# Patient Record
Sex: Female | Born: 1990
Health system: Southern US, Community
[De-identification: ages and names within clinical notes are randomized; demographics above are authoritative.]

## PROBLEM LIST (undated history)

## (undated) DIAGNOSIS — M792 Neuralgia and neuritis, unspecified: Secondary | ICD-10-CM

## (undated) DIAGNOSIS — M545 Low back pain, unspecified: Secondary | ICD-10-CM

## (undated) DIAGNOSIS — G40909 Epilepsy, unspecified, not intractable, without status epilepticus: Secondary | ICD-10-CM

## (undated) DIAGNOSIS — M5410 Radiculopathy, site unspecified: Secondary | ICD-10-CM

## (undated) DIAGNOSIS — G43909 Migraine, unspecified, not intractable, without status migrainosus: Secondary | ICD-10-CM

## (undated) HISTORY — DX: Low back pain, unspecified: M54.50

## (undated) HISTORY — DX: Radiculopathy, site unspecified: M54.10

## (undated) HISTORY — PX: MENISCUS REPAIR: SHX5179

## (undated) HISTORY — DX: Neuralgia and neuritis, unspecified: M79.2

## (undated) HISTORY — DX: Epilepsy, unspecified, not intractable, without status epilepticus: G40.909

## (undated) HISTORY — PX: WISDOM TOOTH EXTRACTION: SHX21

---
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
## 2010-03-10 ENCOUNTER — Emergency Department (HOSPITAL_COMMUNITY): Admission: EM | Admit: 2010-03-10 | Discharge: 2010-03-10 | Payer: Self-pay | Admitting: Emergency Medicine

## 2010-04-26 ENCOUNTER — Emergency Department (HOSPITAL_COMMUNITY): Admission: EM | Admit: 2010-04-26 | Discharge: 2010-04-27 | Payer: Self-pay | Admitting: Emergency Medicine

## 2010-10-13 ENCOUNTER — Emergency Department (HOSPITAL_COMMUNITY): Admission: EM | Admit: 2010-10-13 | Discharge: 2010-10-13 | Payer: Self-pay | Admitting: Emergency Medicine

## 2011-02-02 ENCOUNTER — Emergency Department (HOSPITAL_COMMUNITY)
Admission: EM | Admit: 2011-02-02 | Discharge: 2011-02-03 | Disposition: A | Discharge status: Home / Self Care | Payer: Medicaid Other | Attending: Emergency Medicine | Admitting: Emergency Medicine

## 2011-02-02 DIAGNOSIS — M7989 Other specified soft tissue disorders: Secondary | ICD-10-CM | POA: Insufficient documentation

## 2011-02-02 DIAGNOSIS — M79609 Pain in unspecified limb: Secondary | ICD-10-CM | POA: Insufficient documentation

## 2011-02-02 DIAGNOSIS — L03019 Cellulitis of unspecified finger: Secondary | ICD-10-CM | POA: Insufficient documentation

## 2011-03-11 LAB — URINALYSIS, ROUTINE W REFLEX MICROSCOPIC
Glucose, UA: NEGATIVE mg/dL
Protein, ur: NEGATIVE mg/dL
Urobilinogen, UA: 0.2 mg/dL (ref 0.0–1.0)

## 2011-03-11 LAB — URINE CULTURE
Colony Count: >=100000
Culture  Setup Time: 201110172240

## 2011-03-11 LAB — URINE MICROSCOPIC-ADD ON

## 2011-03-17 LAB — URINALYSIS, ROUTINE W REFLEX MICROSCOPIC
Hgb urine dipstick: NEGATIVE
pH: 5.5 (ref 5.0–8.0)

## 2011-03-17 LAB — URINE CULTURE: Colony Count: >=100000

## 2011-03-17 LAB — PREGNANCY, URINE: Preg Test, Ur: NEGATIVE

## 2011-03-17 LAB — URINE MICROSCOPIC-ADD ON

## 2013-08-08 ENCOUNTER — Ambulatory Visit (INDEPENDENT_AMBULATORY_CARE_PROVIDER_SITE_OTHER): Payer: Medicaid Other | Admitting: Orthopedic Surgery

## 2013-08-08 ENCOUNTER — Encounter: Payer: Self-pay | Admitting: Orthopedic Surgery

## 2013-08-08 VITALS — BP 127/84 | Ht 67.0 in | Wt 230.0 lb

## 2013-08-08 DIAGNOSIS — IMO0002 Reserved for concepts with insufficient information to code with codable children: Secondary | ICD-10-CM

## 2013-08-08 DIAGNOSIS — S8392XA Sprain of unspecified site of left knee, initial encounter: Secondary | ICD-10-CM | POA: Insufficient documentation

## 2013-08-08 MED ORDER — IBUPROFEN 800 MG PO TABS: 800.0000 mg | ORAL_TABLET | Freq: Three times a day (TID) | ORAL | Status: DC | PRN

## 2013-08-08 MED ORDER — HYDROCODONE-ACETAMINOPHEN 5-325 MG PO TABS: 1.0000 | ORAL_TABLET | Freq: Four times a day (QID) | ORAL | Status: DC | PRN

## 2013-08-08 NOTE — Patient Instructions (Signed)
MRI ordered

## 2013-08-08 NOTE — Progress Notes (Signed)
Patient ID: Jessica Barry, female   DOB: 1991-01-22, 22 y.o.   MRN: [id number] Chief Complaint  Patient presents with  . Knee Pain    Left knee pain and sprain d/t fall 07/08/13    22 year old female I did jump a fence her right leg was caught landed on her left leg and she can weight-bear injured her left knee on July 12. 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 Body habitus endomorphic Ambulation with crutches Right knee range of motion full no tenderness or swelling strength and stability normal skin intact neurovascular function intact sensation normal  Left knee limited flexion 90 passive active 70. Full extension. Collateral ligaments feel stable I cannot assess the anterior cruciate ligament feels possibly loose muscle tone normal skin intact neurovascular exam normal  Sprain left knee  Continue crutches ibuprofen hydrocodone  MRI and then followup for evaluation of MRI and treatment recommendation

## 2013-08-22 ENCOUNTER — Telehealth: Payer: Self-pay | Admitting: *Deleted

## 2013-08-22 NOTE — Telephone Encounter (Signed)
Authorization # [id number] expires 09/20/13  MRI appointment scheduled for 08/24/13 3:45pm. Patient aware NPI # [id number] given per Coralee North Follow up made for 09/07/13 at 8:45am. Patient aware

## 2013-08-24 ENCOUNTER — Ambulatory Visit (HOSPITAL_COMMUNITY)
Admission: RE | Admit: 2013-08-24 | Discharge: 2013-08-24 | Disposition: A | Discharge status: Home / Self Care | Payer: Medicaid Other | Source: Ambulatory Visit | Attending: Orthopedic Surgery | Admitting: Orthopedic Surgery

## 2013-08-24 DIAGNOSIS — S83419A Sprain of medial collateral ligament of unspecified knee, initial encounter: Secondary | ICD-10-CM | POA: Insufficient documentation

## 2013-08-24 DIAGNOSIS — S83509A Sprain of unspecified cruciate ligament of unspecified knee, initial encounter: Secondary | ICD-10-CM | POA: Insufficient documentation

## 2013-08-24 DIAGNOSIS — S83289A Other tear of lateral meniscus, current injury, unspecified knee, initial encounter: Secondary | ICD-10-CM | POA: Insufficient documentation

## 2013-08-24 DIAGNOSIS — M25569 Pain in unspecified knee: Secondary | ICD-10-CM | POA: Insufficient documentation

## 2013-08-24 DIAGNOSIS — S8392XA Sprain of unspecified site of left knee, initial encounter: Secondary | ICD-10-CM

## 2013-08-24 DIAGNOSIS — W19XXXA Unspecified fall, initial encounter: Secondary | ICD-10-CM | POA: Insufficient documentation

## 2013-08-25 ENCOUNTER — Telehealth: Payer: Self-pay | Admitting: Orthopedic Surgery

## 2013-08-29 ENCOUNTER — Other Ambulatory Visit: Payer: Self-pay | Admitting: Orthopedic Surgery

## 2013-08-29 DIAGNOSIS — S8392XA Sprain of unspecified site of left knee, initial encounter: Secondary | ICD-10-CM

## 2013-08-29 MED ORDER — HYDROCODONE-ACETAMINOPHEN 5-325 MG PO TABS: 1.0000 | ORAL_TABLET | Freq: Four times a day (QID) | ORAL | Status: DC | PRN

## 2013-08-29 NOTE — Telephone Encounter (Signed)
Prescription has been refilled call in to pharmacy

## 2013-09-07 ENCOUNTER — Other Ambulatory Visit: Payer: Self-pay | Admitting: *Deleted

## 2013-09-07 ENCOUNTER — Encounter: Payer: Self-pay | Admitting: Orthopedic Surgery

## 2013-09-07 ENCOUNTER — Encounter (HOSPITAL_COMMUNITY): Payer: Self-pay | Admitting: Pharmacy Technician

## 2013-09-07 ENCOUNTER — Ambulatory Visit (INDEPENDENT_AMBULATORY_CARE_PROVIDER_SITE_OTHER): Payer: Self-pay | Admitting: Orthopedic Surgery

## 2013-09-07 VITALS — BP 119/73 | Ht 67.0 in | Wt 230.0 lb

## 2013-09-07 DIAGNOSIS — S83502D Sprain of unspecified cruciate ligament of left knee, subsequent encounter: Secondary | ICD-10-CM

## 2013-09-07 DIAGNOSIS — S83282S Other tear of lateral meniscus, current injury, left knee, sequela: Secondary | ICD-10-CM

## 2013-09-07 DIAGNOSIS — S83282D Other tear of lateral meniscus, current injury, left knee, subsequent encounter: Secondary | ICD-10-CM

## 2013-09-07 DIAGNOSIS — S8392XA Sprain of unspecified site of left knee, initial encounter: Secondary | ICD-10-CM

## 2013-09-07 DIAGNOSIS — M23302 Other meniscus derangements, unspecified lateral meniscus, unspecified knee: Secondary | ICD-10-CM | POA: Insufficient documentation

## 2013-09-07 DIAGNOSIS — IMO0002 Reserved for concepts with insufficient information to code with codable children: Secondary | ICD-10-CM

## 2013-09-07 DIAGNOSIS — Z5189 Encounter for other specified aftercare: Secondary | ICD-10-CM

## 2013-09-07 DIAGNOSIS — S83509A Sprain of unspecified cruciate ligament of unspecified knee, initial encounter: Secondary | ICD-10-CM | POA: Insufficient documentation

## 2013-09-07 DIAGNOSIS — S83289A Other tear of lateral meniscus, current injury, unspecified knee, initial encounter: Secondary | ICD-10-CM | POA: Insufficient documentation

## 2013-09-07 MED ORDER — HYDROCODONE-ACETAMINOPHEN 5-325 MG PO TABS: 1.0000 | ORAL_TABLET | ORAL | Status: DC | PRN

## 2013-09-07 NOTE — Addendum Note (Signed)
Addended by: Fuller Canada E on: 09/07/2013 01:40 PM   Modules accepted: Orders

## 2013-09-07 NOTE — Progress Notes (Signed)
Patient ID: Jessica Barry, female   DOB: 1991-02-24, 22 y.o.   MRN: [id number]  Chief Complaint  Patient presents with  . Results    MRI Results/s/p left knee injury     MRI followup shows that the patient is a torn medial collateral ligament, anterior cruciate ligament, lateral meniscus. However, she is low demand in terms of activity. I discussed this with her and discussed with her Medicaid limitations on therapy and the cost and we both agreed that she should undergo arthroscopic meniscectomy and rehabilitation on her own to see if she needs reconstructive surgery  Patient ID: Jessica Barry, female   DOB: December 26, 1991, 22 y.o.   MRN: [id number] Chief Complaint   Patient presents with   .  Knee Pain       Left knee pain and sprain d/t fall 07/08/13     22 year old female I did jump a fence her right leg was caught landed on her left leg and she can weight-bear injured her left knee on July 12. Since that time she's been on crutches or brace taken various pain medications and ibuprofen and presents with sharp dull throbbing 5 at 10 knee pain associated numbness tingling locking catching swelling stiffness and bruising review of systems chills cough diarrhea nausea frequency poor healing and itching of the skin numbness tingling unsteady gait depression excessive thirst and urination temperature intolerance the other systems were reviewed and are normal IMPRESSION: 1. Complex tear of the lateral meniscus, with posterior horn believed to be flipped adjacent to the anterior horn and possible radial tear of the anterior horn. 2. Torn anterior cruciate ligament. 3. Torn medial collateral ligament. Tear of the adjacent attachments of the medial patellar retinaculum and medial patellofemoral ligament. 4. Edema tracks along the fibular collateral ligament without overt fibular collateral ligament tear. 5. Moderate to large knee effusion. 6. Bone bruising pattern in the patellofemoral joint raising  the possibility of prior transient lateral patellar dislocation. There is also bone bruising posteriorly in the lateral tibial plateau and a pivot-shift mechanism of the bone bruising may also be present.  Independent review this MRI confirms the findings listed above  Patient has no neurologic symptoms at this time  Recommend arthroscopy followup after arthroscopy lateral meniscectomy

## 2013-09-07 NOTE — Patient Instructions (Addendum)
Surgery  Salk lateral menisectomy

## 2013-09-15 ENCOUNTER — Encounter (HOSPITAL_COMMUNITY): Payer: Self-pay

## 2013-09-15 ENCOUNTER — Encounter (HOSPITAL_COMMUNITY)
Admission: RE | Admit: 2013-09-15 | Discharge: 2013-09-15 | Disposition: A | Discharge status: Home / Self Care | Payer: Medicaid Other | Source: Ambulatory Visit | Attending: Orthopedic Surgery | Admitting: Orthopedic Surgery

## 2013-09-15 DIAGNOSIS — Z01818 Encounter for other preprocedural examination: Secondary | ICD-10-CM | POA: Insufficient documentation

## 2013-09-15 DIAGNOSIS — Z01812 Encounter for preprocedural laboratory examination: Secondary | ICD-10-CM | POA: Insufficient documentation

## 2013-09-15 HISTORY — DX: Migraine, unspecified, not intractable, without status migrainosus: G43.909

## 2013-09-15 LAB — HEMOGLOBIN AND HEMATOCRIT, BLOOD: Hemoglobin: 13.7 g/dL (ref 12.0–15.0)

## 2013-09-15 NOTE — Patient Instructions (Signed)
Jessica Barry  09/15/2013   Your procedure is scheduled on:  Friday, 09/22/13  Report to [facility] [place] [facility].  Call this number if you have problems the morning of surgery: [phone]   Remember:   Do not eat food or drink liquids after midnight.   Take these medicines the morning of surgery with A SIP OF WATER: norco if needed   Do not wear jewelry, make-up or nail polish.  Do not wear lotions, powders, or perfumes. You may wear deodorant.  Do not shave 48 hours prior to surgery. Men may shave face and neck.  Do not bring valuables to the hospital.  [facility] is not responsible                   for any belongings or valuables.  Contacts, dentures or bridgework may not be worn into surgery.  Leave suitcase in the car. After surgery it may be brought to your room.  For patients admitted to the hospital, checkout time is 11:00 AM the day of  discharge.   Patients discharged the day of surgery will not be allowed to drive  home.  Name and phone number of your driver: family  Special Instructions: Incentive Spirometry - Practice and bring it with you on the day of surgery. Shower using CHG 2 nights before surgery and the night before surgery.  If you shower the day of surgery use CHG.  Use special wash - you have one bottle of CHG for all showers.  You should use approximately 1/3 of the bottle for each shower.   Please read over the following fact sheets that you were given: Surgical Site Infection Prevention, Anesthesia Post-op Instructions and Care and Recovery After Surgery  Arthroscopic Procedure, Knee Care After Refer to this sheet in the next few weeks. These discharge instructions provide you with general information on caring for yourself after you leave the hospital. Your caregiver may also give you specific instructions. Your treatment has been planned according to the most current medical practices available, but unavoidable complications sometimes occur. If you have any  problems or questions after discharge, please call your caregiver. HOME CARE INSTRUCTIONS  It will be normal to be sore for a couple days after surgery. See your caregiver if this seems to be getting worse rather than better. Only take over-the-counter or prescription medicines for pain, discomfort, or fever as directed by your caregiver.  Take showers rather than baths, or as directed by your caregiver.  Change bandages (dressings) if necessary or as directed.  You may resume normal diet and activities as directed or allowed.  Avoid lifting or driving until you are directed otherwise.  Make an appointment to see your caregiver for stitches (suture) or staple removal as directed.  You may put ice on the area.  Put ice in a plastic bag.  Place a towel between your skin and the bag.  Leave the ice on for 15-20 minutes, 3-4 times per day for the first 2 days. SEEK MEDICAL CARE IF:   You have increased bleeding from your wounds.  You see redness, swelling, or have increasing pain in your wounds.  You have pus coming from your wound.  You have an oral temperature above 102 F (38.9 C).  You notice a bad smell coming from the wound or dressing.  You have severe pain with any motion of your knee. SEEK IMMEDIATE MEDICAL CARE IF:   You develop a rash.  You have  difficulty breathing  You develop any reaction or side effects to medicines taken. MAKE SURE YOU:   Understand these instructions.  Will watch your condition.  Will get help right away if you are not doing well or get worse. Document Released: 07/03/2005 Document Revised: 03/07/2012 Document Reviewed: 03/08/2008 [facility] Patient Information 2014 [place], [place]. Arthroscopic Procedure, Knee An arthroscopic procedure can find what is wrong with your knee. PROCEDURE Arthroscopy is a surgical technique that allows your orthopedic surgeon to diagnose and treat your knee injury with accuracy. They will look into your  knee through a small instrument. This is almost like a small (pencil sized) telescope. Because arthroscopy affects your knee less than open knee surgery, you can anticipate a more rapid recovery. Taking an active role by following your caregiver's instructions will help with rapid and complete recovery. Use crutches, rest, elevation, ice, and knee exercises as instructed. The length of recovery depends on various factors including type of injury, age, physical condition, medical conditions, and your rehabilitation. Your knee is the joint between the large bones (femur and tibia) in your leg. Cartilage covers these bone ends which are smooth and slippery and allow your knee to bend and move smoothly. Two menisci, thick, semi-lunar shaped pads of cartilage which form a rim inside the joint, help absorb shock and stabilize your knee. Ligaments bind the bones together and support your knee joint. Muscles move the joint, help support your knee, and take stress off the joint itself. Because of this all programs and physical therapy to rehabilitate an injured or repaired knee require rebuilding and strengthening your muscles. AFTER THE PROCEDURE  After the procedure, you will be moved to a recovery area until most of the effects of the medication have worn off. Your caregiver will discuss the test results with you.  Only take over-the-counter or prescription medicines for pain, discomfort, or fever as directed by your caregiver. SEEK MEDICAL CARE IF:   You have increased bleeding from your wounds.  You see redness, swelling, or have increasing pain in your wounds.  You have pus coming from your wound.  You have an oral temperature above 102 F (38.9 C).  You notice a bad smell coming from the wound or dressing.  You have severe pain with any motion of your knee. SEEK IMMEDIATE MEDICAL CARE IF:   You develop a rash.  You have difficulty breathing.  You have any allergic problems. Document  Released: 12/11/2000 Document Revised: 03/07/2012 Document Reviewed: 07/04/2008 [facility] Patient Information 2014 [place], [place]. PATIENT INSTRUCTIONS POST-ANESTHESIA  IMMEDIATELY FOLLOWING SURGERY:  Do not drive or operate machinery for the first twenty four hours after surgery.  Do not make any important decisions for twenty four hours after surgery or while taking narcotic pain medications or sedatives.  If you develop intractable nausea and vomiting or a severe headache please notify your doctor immediately.  FOLLOW-UP:  Please make an appointment with your surgeon as instructed. You do not need to follow up with anesthesia unless specifically instructed to do so.  WOUND CARE INSTRUCTIONS (if applicable):  Keep a dry clean dressing on the anesthesia/puncture wound site if there is drainage.  Once the wound has quit draining you may leave it open to air.  Generally you should leave the bandage intact for twenty four hours unless there is drainage.  If the epidural site drains for more than 36-48 hours please call the anesthesia department.  QUESTIONS?:  Please feel free to call your physician or the hospital operator  if you have any questions, and they will be happy to assist you.

## 2013-09-21 ENCOUNTER — Telehealth: Payer: Self-pay | Admitting: Orthopedic Surgery

## 2013-09-21 NOTE — H&P (Signed)
  Patient ID: Jessica Barry, female   DOB: 1991-08-16, 22 y.o.   MRN: [id number] Chief Complaint   Patient presents with   .  Knee Pain       Left knee pain and sprain d/t fall 07/08/13     22 year old female I did jump a fence her right leg was caught landed on her left leg and she can weight-bear injured her left knee on July 12. 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 Body habitus endomorphic Ambulation with crutches  Upper extremity exam  The right and left upper extremity:   Inspection and palpation revealed no abnormalities in the upper extremities.   Range of motion is full without contracture.  Motor exam is normal with grade 5 strength.  The joints are fully reduced without subluxation.  There is no atrophy or tremor and muscle tone is normal.  All joints are stable.    Right knee range of motion full no tenderness or swelling strength and stability normal skin intact neurovascular function intact sensation normal  Left knee limited flexion 90 passive active 70. Full extension. Collateral ligaments feel stable I cannot assess the anterior cruciate ligament feels possibly loose  muscle tone normal skin intact neurovascular exam normal   IMPRESSION: 1. Complex tear of the lateral meniscus, with posterior horn believed to be flipped adjacent to the anterior horn and possible radial tear of the anterior horn. 2. Torn anterior cruciate ligament. 3. Torn medial collateral ligament. Tear of the adjacent attachments of the medial patellar retinaculum and medial patellofemoral ligament. 4. Edema tracks along the fibular collateral ligament without overt fibular collateral ligament tear. 5. Moderate to large knee effusion. 6. Bone bruising pattern in the patellofemoral joint raising the possibility of prior transient lateral patellar dislocation. There is also bone bruising posteriorly in the lateral tibial plateau and a pivot-shift mechanism of the bone bruising may also be present.   Plan arthroscopy left knee partial medial meniscectomy and joint debridement. We have decided not to reconstruct the anterior cruciate ligament at this time based on her size and her activity level

## 2013-09-21 NOTE — Telephone Encounter (Signed)
Regarding out-patient surgery scheduled 09/22/13 at [facility], Cpt [phone], [phone], ICD9 codes 717.2, 905.7, 944.9 - contacted Medicaid/[employer], and accessed [facility] [employer] Medicaid website.  Pre-authorization is not required for codes noted.

## 2013-09-22 ENCOUNTER — Encounter (HOSPITAL_COMMUNITY): Payer: Self-pay | Admitting: *Deleted

## 2013-09-22 ENCOUNTER — Ambulatory Visit (HOSPITAL_COMMUNITY)
Admission: RE | Admit: 2013-09-22 | Discharge: 2013-09-22 | Disposition: A | Discharge status: Home / Self Care | Payer: Medicaid Other | Source: Ambulatory Visit | Attending: Orthopedic Surgery | Admitting: Orthopedic Surgery

## 2013-09-22 ENCOUNTER — Encounter (HOSPITAL_COMMUNITY)
Admission: RE | Disposition: A | Discharge status: Home / Self Care | Payer: Self-pay | Source: Ambulatory Visit | Attending: Orthopedic Surgery

## 2013-09-22 ENCOUNTER — Ambulatory Visit (HOSPITAL_COMMUNITY): Payer: Medicaid Other | Admitting: Anesthesiology

## 2013-09-22 ENCOUNTER — Encounter (HOSPITAL_COMMUNITY): Payer: Self-pay | Admitting: Anesthesiology

## 2013-09-22 DIAGNOSIS — S83412D Sprain of medial collateral ligament of left knee, subsequent encounter: Secondary | ICD-10-CM

## 2013-09-22 DIAGNOSIS — S83502D Sprain of unspecified cruciate ligament of left knee, subsequent encounter: Secondary | ICD-10-CM

## 2013-09-22 DIAGNOSIS — M23301 Other meniscus derangements, unspecified lateral meniscus, left knee: Secondary | ICD-10-CM

## 2013-09-22 DIAGNOSIS — S8392XD Sprain of unspecified site of left knee, subsequent encounter: Secondary | ICD-10-CM

## 2013-09-22 DIAGNOSIS — S83289A Other tear of lateral meniscus, current injury, unspecified knee, initial encounter: Secondary | ICD-10-CM | POA: Insufficient documentation

## 2013-09-22 DIAGNOSIS — Z5189 Encounter for other specified aftercare: Secondary | ICD-10-CM

## 2013-09-22 DIAGNOSIS — Y929 Unspecified place or not applicable: Secondary | ICD-10-CM | POA: Insufficient documentation

## 2013-09-22 DIAGNOSIS — M23302 Other meniscus derangements, unspecified lateral meniscus, unspecified knee: Secondary | ICD-10-CM

## 2013-09-22 DIAGNOSIS — S83411A Sprain of medial collateral ligament of right knee, initial encounter: Secondary | ICD-10-CM

## 2013-09-22 DIAGNOSIS — X500XXA Overexertion from strenuous movement or load, initial encounter: Secondary | ICD-10-CM | POA: Insufficient documentation

## 2013-09-22 HISTORY — PX: KNEE ARTHROSCOPY WITH LATERAL MENISECTOMY: SHX6193

## 2013-09-22 SURGERY — ARTHROSCOPY, KNEE, WITH LATERAL MENISCECTOMY
Anesthesia: General | Site: Knee | Laterality: Left | Wound class: Clean

## 2013-09-22 MED ORDER — MIDAZOLAM HCL 5 MG/5ML IJ SOLN
INTRAMUSCULAR | Status: DC | PRN
  Administered 2013-09-22: 2 mg via INTRAVENOUS

## 2013-09-22 MED ORDER — FENTANYL CITRATE 0.05 MG/ML IJ SOLN
INTRAMUSCULAR | Status: AC
  Filled 2013-09-22: qty 2

## 2013-09-22 MED ORDER — BUPIVACAINE-EPINEPHRINE PF 0.5-1:200000 % IJ SOLN
INTRAMUSCULAR | Status: AC
  Filled 2013-09-22: qty 10

## 2013-09-22 MED ORDER — HYDROCODONE-ACETAMINOPHEN 10-325 MG PO TABS: 1.0000 | ORAL_TABLET | Freq: Four times a day (QID) | ORAL | Status: DC | PRN

## 2013-09-22 MED ORDER — ONDANSETRON HCL 4 MG/2ML IJ SOLN: 4.0000 mg | Freq: Once | INTRAMUSCULAR | Status: DC | PRN

## 2013-09-22 MED ORDER — CEFAZOLIN SODIUM 1-5 GM-% IV SOLN
INTRAVENOUS | Status: AC
  Filled 2013-09-22: qty 50

## 2013-09-22 MED ORDER — ONDANSETRON HCL 4 MG/2ML IJ SOLN
4.0000 mg | Freq: Once | INTRAMUSCULAR | Status: AC
  Administered 2013-09-22: 4 mg via INTRAVENOUS

## 2013-09-22 MED ORDER — ONDANSETRON HCL 4 MG/2ML IJ SOLN
INTRAMUSCULAR | Status: AC
  Filled 2013-09-22: qty 2

## 2013-09-22 MED ORDER — FENTANYL CITRATE 0.05 MG/ML IJ SOLN
25.0000 ug | INTRAMUSCULAR | Status: DC | PRN
  Administered 2013-09-22: 50 ug via INTRAVENOUS
  Filled 2013-09-22: qty 2

## 2013-09-22 MED ORDER — MIDAZOLAM HCL 2 MG/2ML IJ SOLN
1.0000 mg | INTRAMUSCULAR | Status: DC | PRN
  Administered 2013-09-22: 2 mg via INTRAVENOUS

## 2013-09-22 MED ORDER — GLYCOPYRROLATE 0.2 MG/ML IJ SOLN
0.2000 mg | Freq: Once | INTRAMUSCULAR | Status: AC
  Administered 2013-09-22: 0.2 mg via INTRAVENOUS

## 2013-09-22 MED ORDER — PROPOFOL 10 MG/ML IV EMUL
INTRAVENOUS | Status: AC
  Filled 2013-09-22: qty 20

## 2013-09-22 MED ORDER — BUPIVACAINE-EPINEPHRINE (PF) 0.5% -1:200000 IJ SOLN
INTRAMUSCULAR | Status: DC | PRN
  Administered 2013-09-22 (×2): 30 mL

## 2013-09-22 MED ORDER — SUCCINYLCHOLINE CHLORIDE 20 MG/ML IJ SOLN
INTRAMUSCULAR | Status: AC
  Filled 2013-09-22: qty 1

## 2013-09-22 MED ORDER — EPINEPHRINE HCL 1 MG/ML IJ SOLN
INTRAMUSCULAR | Status: AC
  Filled 2013-09-22: qty 4

## 2013-09-22 MED ORDER — CEFAZOLIN SODIUM-DEXTROSE 2-3 GM-% IV SOLR: 2.0000 g | INTRAVENOUS | Status: DC

## 2013-09-22 MED ORDER — PROPOFOL 10 MG/ML IV BOLUS
INTRAVENOUS | Status: DC | PRN
  Administered 2013-09-22: 180 mg via INTRAVENOUS

## 2013-09-22 MED ORDER — MIDAZOLAM HCL 2 MG/2ML IJ SOLN
INTRAMUSCULAR | Status: AC
  Filled 2013-09-22: qty 2

## 2013-09-22 MED ORDER — SODIUM CHLORIDE 0.9 % IR SOLN
Status: DC | PRN
  Administered 2013-09-22 (×4)

## 2013-09-22 MED ORDER — CEFAZOLIN SODIUM-DEXTROSE 2-3 GM-% IV SOLR
INTRAVENOUS | Status: AC
  Filled 2013-09-22: qty 50

## 2013-09-22 MED ORDER — SUCCINYLCHOLINE CHLORIDE 20 MG/ML IJ SOLN
INTRAMUSCULAR | Status: DC | PRN
  Administered 2013-09-22: 180 mg via INTRAVENOUS

## 2013-09-22 MED ORDER — DEXTROSE 5 % IV SOLN
3.0000 g | Freq: Once | INTRAVENOUS | Status: AC
  Administered 2013-09-22: 1 g via INTRAVENOUS
  Filled 2013-09-22: qty 3000

## 2013-09-22 MED ORDER — PROMETHAZINE HCL 12.5 MG PO TABS: 12.5000 mg | ORAL_TABLET | Freq: Four times a day (QID) | ORAL | Status: DC | PRN

## 2013-09-22 MED ORDER — CHLORHEXIDINE GLUCONATE 4 % EX LIQD: 60.0000 mL | Freq: Once | CUTANEOUS | Status: DC

## 2013-09-22 MED ORDER — GLYCOPYRROLATE 0.2 MG/ML IJ SOLN
INTRAMUSCULAR | Status: AC
  Filled 2013-09-22: qty 1

## 2013-09-22 MED ORDER — IBUPROFEN 800 MG PO TABS: 800.0000 mg | ORAL_TABLET | Freq: Three times a day (TID) | ORAL | Status: DC | PRN

## 2013-09-22 MED ORDER — FENTANYL CITRATE 0.05 MG/ML IJ SOLN
INTRAMUSCULAR | Status: DC | PRN
  Administered 2013-09-22: 100 ug via INTRAVENOUS
  Administered 2013-09-22 (×4): 50 ug via INTRAVENOUS

## 2013-09-22 MED ORDER — LACTATED RINGERS IV SOLN
INTRAVENOUS | Status: DC
  Administered 2013-09-22: 1000 mL via INTRAVENOUS

## 2013-09-22 MED ORDER — LIDOCAINE HCL 1 % IJ SOLN
INTRAMUSCULAR | Status: DC | PRN
  Administered 2013-09-22: 50 mg via INTRADERMAL

## 2013-09-22 MED ORDER — ROCURONIUM BROMIDE 100 MG/10ML IV SOLN
INTRAVENOUS | Status: DC | PRN
  Administered 2013-09-22: 25 mg via INTRAVENOUS

## 2013-09-22 MED ORDER — ROCURONIUM BROMIDE 50 MG/5ML IV SOLN
INTRAVENOUS | Status: AC
  Filled 2013-09-22: qty 1

## 2013-09-22 MED ORDER — LIDOCAINE HCL (PF) 1 % IJ SOLN
INTRAMUSCULAR | Status: AC
  Filled 2013-09-22: qty 5

## 2013-09-22 SURGICAL SUPPLY — 44 items
BAG HAMPER (MISCELLANEOUS) ×2 IMPLANT
BANDAGE ELASTIC 6 VELCRO NS (GAUZE/BANDAGES/DRESSINGS) ×2 IMPLANT
BLADE AGGRESSIVE PLUS 4.0 (BLADE) ×2 IMPLANT
BLADE SURG SZ11 CARB STEEL (BLADE) ×2 IMPLANT
CHLORAPREP W/TINT 26ML (MISCELLANEOUS) ×4 IMPLANT
CLOTH BEACON ORANGE TIMEOUT ST (SAFETY) ×2 IMPLANT
COOLER CRYO IC GRAV AND TUBE (ORTHOPEDIC SUPPLIES) ×2 IMPLANT
CUFF CRYO KNEE LG 20X31 COOLER (ORTHOPEDIC SUPPLIES) ×2 IMPLANT
CUFF TOURNIQUET SINGLE 44IN (TOURNIQUET CUFF) ×2 IMPLANT
CUTTER ANGLED DBL BITE 4.5 (BURR) ×2 IMPLANT
DECANTER SPIKE VIAL GLASS SM (MISCELLANEOUS) ×4 IMPLANT
GAUZE SPONGE 4X4 16PLY XRAY LF (GAUZE/BANDAGES/DRESSINGS) ×2 IMPLANT
GAUZE XEROFORM 5X9 LF (GAUZE/BANDAGES/DRESSINGS) ×4 IMPLANT
GLOVE BIOGEL PI IND STRL 7.5 (GLOVE) ×1 IMPLANT
GLOVE BIOGEL PI INDICATOR 7.5 (GLOVE) ×1
GLOVE ECLIPSE 7.0 STRL STRAW (GLOVE) ×2 IMPLANT
GLOVE EXAM NITRILE LRG STRL (GLOVE) ×2 IMPLANT
GLOVE SKINSENSE NS SZ8.0 LF (GLOVE) ×1
GLOVE SKINSENSE STRL SZ8.0 LF (GLOVE) ×1 IMPLANT
GLOVE SS N UNI LF 8.5 STRL (GLOVE) ×2 IMPLANT
GOWN STRL REIN XL XLG (GOWN DISPOSABLE) ×4 IMPLANT
HLDR LEG FOAM (MISCELLANEOUS) ×1 IMPLANT
IV NS IRRIG 3000ML ARTHROMATIC (IV SOLUTION) ×6 IMPLANT
KIT BLADEGUARD II DBL (SET/KITS/TRAYS/PACK) ×2 IMPLANT
KIT ROOM TURNOVER AP CYSTO (KITS) ×2 IMPLANT
LEG HOLDER FOAM (MISCELLANEOUS) ×1
MANIFOLD NEPTUNE II (INSTRUMENTS) ×2 IMPLANT
MARKER SKIN DUAL TIP RULER LAB (MISCELLANEOUS) ×2 IMPLANT
NEEDLE HYPO 18GX1.5 BLUNT FILL (NEEDLE) ×2 IMPLANT
NEEDLE HYPO 21X1.5 SAFETY (NEEDLE) ×2 IMPLANT
NEEDLE SPNL 18GX3.5 QUINCKE PK (NEEDLE) ×2 IMPLANT
NS IRRIG 1000ML POUR BTL (IV SOLUTION) ×2 IMPLANT
PAD ABD 5X9 TENDERSORB (GAUZE/BANDAGES/DRESSINGS) ×2 IMPLANT
PAD ARMBOARD 7.5X6 YLW CONV (MISCELLANEOUS) ×4 IMPLANT
PADDING CAST COTTON 6X4 STRL (CAST SUPPLIES) ×4 IMPLANT
SET ARTHROSCOPY INST (INSTRUMENTS) ×2 IMPLANT
SET ARTHROSCOPY PUMP TUBE (IRRIGATION / IRRIGATOR) ×2 IMPLANT
SET BASIN LINEN APH (SET/KITS/TRAYS/PACK) ×2 IMPLANT
SPONGE GAUZE 4X4 12PLY (GAUZE/BANDAGES/DRESSINGS) ×2 IMPLANT
SUT ETHILON 3 0 FSL (SUTURE) ×2 IMPLANT
SYR 30ML LL (SYRINGE) ×2 IMPLANT
SYRINGE 10CC LL (SYRINGE) ×2 IMPLANT
WAND 90 DEG TURBOVAC W/CORD (SURGICAL WAND) ×4 IMPLANT
YANKAUER SUCT BULB TIP 10FT TU (MISCELLANEOUS) ×8 IMPLANT

## 2013-09-22 NOTE — Interval H&P Note (Signed)
History and Physical Interval Note:  09/22/2013 7:18 AM  Jessica Barry  has presented today for surgery, with the diagnosis of Lateral meniscal tear left knee  The various methods of treatment have been discussed with the patient and family. After consideration of risks, benefits and other options for treatment, the patient has consented to  Procedure(s): KNEE ARTHROSCOPY WITH LATERAL MENISECTOMY (Left) as a surgical intervention .  The patient's history has been reviewed, patient examined, no change in status, stable for surgery.  I have reviewed the patient's chart and labs.  Questions were answered to the patient's satisfaction.     Fuller Canada

## 2013-09-22 NOTE — Anesthesia Procedure Notes (Signed)
Procedure Name: Intubation Date/Time: 09/22/2013 7:40 AM Performed by: Despina Hidden Pre-anesthesia Checklist: Emergency Drugs available, Suction available, Patient being monitored and Patient identified Patient Re-evaluated:Patient Re-evaluated prior to inductionOxygen Delivery Method: Circle system utilized Preoxygenation: Pre-oxygenation with 100% oxygen Intubation Type: IV induction, Rapid sequence and Cricoid Pressure applied Ventilation: Mask ventilation without difficulty Laryngoscope Size: Mac and 3 Grade View: Grade I Tube type: Oral Tube size: 7.0 mm Number of attempts: 1 Airway Equipment and Method: Stylet Placement Confirmation: ETT inserted through vocal cords under direct vision,  positive ETCO2 and breath sounds checked- equal and bilateral Secured at: 21 cm Tube secured with: Tape Dental Injury: Teeth and Oropharynx as per pre-operative assessment

## 2013-09-22 NOTE — Anesthesia Preprocedure Evaluation (Signed)
Anesthesia Evaluation  Patient identified by MRN, date of birth, ID band Patient awake    Reviewed: Allergy & Precautions, H&P , NPO status , Patient's Chart, lab work & pertinent test results, reviewed documented beta blocker date and time   Airway Mallampati: II  Neck ROM: Full    Dental  (+) Teeth Intact   Pulmonary neg pulmonary ROS,  breath sounds clear to auscultation        Cardiovascular negative cardio ROS  Rhythm:Regular Rate:Normal     Neuro/Psych  Headaches,    GI/Hepatic   Endo/Other  Morbid obesity  Renal/GU      Musculoskeletal   Abdominal   Peds  Hematology   Anesthesia Other Findings   Reproductive/Obstetrics                           Anesthesia Physical Anesthesia Plan  ASA: II  Anesthesia Plan: General   Post-op Pain Management:    Induction: Intravenous, Rapid sequence and Cricoid pressure planned  Airway Management Planned: Oral ETT  Additional Equipment:   Intra-op Plan:   Post-operative Plan: Extubation in OR  Informed Consent: I have reviewed the patients History and Physical, chart, labs and discussed the procedure including the risks, benefits and alternatives for the proposed anesthesia with the patient or authorized representative who has indicated his/her understanding and acceptance.     Plan Discussed with:   Anesthesia Plan Comments:         Anesthesia Quick Evaluation

## 2013-09-22 NOTE — Anesthesia Postprocedure Evaluation (Signed)
  Anesthesia Post-op Note  Patient: Jessica Barry  Procedure(s) Performed: Procedure(s): LEFT KNEE ARTHROSCOPY WITH LATERAL MENISECTOMY (Left)  Patient Location: PACU  Anesthesia Type:General  Level of Consciousness: awake, alert , oriented and patient cooperative  Airway and Oxygen Therapy: Patient Spontanous Breathing  Post-op Pain: 3 /10, mild  Post-op Assessment: Post-op Vital signs reviewed, Patient's Cardiovascular Status Stable, Respiratory Function Stable, Patent Airway and Pain level controlled  Post-op Vital Signs: Reviewed and stable  Complications: No apparent anesthesia complications

## 2013-09-22 NOTE — Addendum Note (Signed)
Addendum created 09/22/13 0919 by Despina Hidden, CRNA   Modules edited: Anesthesia Medication Administration

## 2013-09-22 NOTE — Op Note (Signed)
09/22/2013  8:57 AM  PATIENT:  Jessica Barry  22 y.o. female  PRE-OPERATIVE DIAGNOSIS:  Lateral meniscal tear left knee  POST-OPERATIVE DIAGNOSIS:  Lateral meniscal tear left knee  Operative findings: Exam under anesthesia revealed a 1+ Lachman and negative pivot shift stable collateral ligaments stable PCL  Intraoperative findings revealed intact medial meniscus partially torn anterior cruciate ligament with intact posterior lateral fibers of the anterior cruciate ligament, posterior horn medial meniscal tear the popliteus hiatus normal patellofemoral joint  Procedure in detail The left knee was marked for surgery in the preop area after confirmation of surgical site. Chart review was completed. Patient was taken to the operating room for general anesthesia. Ancef 3 g given based on weight of greater than 120 kg.  Sterile prep and drape was performed  Exam under anesthesia was completed. Findings are noted above.  After sterile prep and drape, timeout was completed. the portals were injected with dilute Marcaine and epinephrine solution. The scope was placed through the lateral portal into the joint. A diagnostic arthroscopy was completed with a circumferential tour of the knee. The medial meniscus was probed and intact the medial joint space articular surfaces were normal. The notch was evaluated the PCL was normal the anterior cruciate ligament was partially torn but intact posterior lateral bundle. A portion of the anterior bundle was scarred to the PCL. A portion of the anterior bundle was also torn and debrided. The lateral meniscus was evaluated and found to be torn in the area of the popliteus hiatus, an accessory portal was established to better access the posterior horn of lateral meniscus. We then completed the torn portion of the lateral meniscus . Stable rim was confirmed by probe  The patellofemoral joint was normal  The ArthroCare wand malfunctioned and was not usable  throughout the case.  The knee was irrigated and closed with 3-0 nylon sutures PROCEDURE:  Procedure(s): LEFT KNEE ARTHROSCOPY WITH LATERAL MENISECTOMY (Left)  SURGEON:  Surgeon(s) and Role:    * Homer Miller E Shams Fill, MD - Primary  PHYSICIAN ASSISTANT:   ASSISTANTS: none   ANESTHESIA:   general  EBL:  Total I/O In: 600 [I.V.:600] Out: -   BLOOD ADMINISTERED:none  DRAINS: none   LOCAL MEDICATIONS USED:  OTHER Marcaine with epinephrine 0.5% total of 60 cc injected into the joint  SPECIMEN:  No Specimen  DISPOSITION OF SPECIMEN:  N/A  COUNTS:  YES  TOURNIQUET:    DICTATION: .Dragon Dictation  PLAN OF CARE: Discharge to home after PACU  PATIENT DISPOSITION:  PACU - hemodynamically stable.   Delay start of Pharmacological VTE agent (>24hrs) due to surgical blood loss or risk of bleeding: not applicable  The patient be weightbearing as tolerated with a walker or crutches.  She will be followed as an outpatient and started on a physical therapy program  

## 2013-09-22 NOTE — Transfer of Care (Signed)
Immediate Anesthesia Transfer of Care Note  Patient: Jessica Barry  Procedure(s) Performed: Procedure(s): LEFT KNEE ARTHROSCOPY WITH LATERAL MENISECTOMY (Left)  Patient Location: PACU  Anesthesia Type:General  Level of Consciousness: sedated  Airway & Oxygen Therapy: Patient Spontanous Breathing and Patient connected to face mask oxygen  Post-op Assessment: Report given to PACU RN and Post -op Vital signs reviewed and stable  Post vital signs: Reviewed and stable  Complications: No apparent anesthesia complications

## 2013-09-22 NOTE — Brief Op Note (Addendum)
09/22/2013  8:57 AM  PATIENT:  Jessica Barry  22 y.o. female  PRE-OPERATIVE DIAGNOSIS:  Lateral meniscal tear left knee  POST-OPERATIVE DIAGNOSIS:  Lateral meniscal tear left knee  Operative findings: Exam under anesthesia revealed a 1+ Lachman and negative pivot shift stable collateral ligaments stable PCL  Intraoperative findings revealed intact medial meniscus partially torn anterior cruciate ligament with intact posterior lateral fibers of the anterior cruciate ligament, posterior horn medial meniscal tear the popliteus hiatus normal patellofemoral joint  Procedure in detail The left knee was marked for surgery in the preop area after confirmation of surgical site. Chart review was completed. Patient was taken to the operating room for general anesthesia. Ancef 3 g given based on weight of greater than 120 kg.  Sterile prep and drape was performed  Exam under anesthesia was completed. Findings are noted above.  After sterile prep and drape, timeout was completed. the portals were injected with dilute Marcaine and epinephrine solution. The scope was placed through the lateral portal into the joint. A diagnostic arthroscopy was completed with a circumferential tour of the knee. The medial meniscus was probed and intact the medial joint space articular surfaces were normal. The notch was evaluated the PCL was normal the anterior cruciate ligament was partially torn but intact posterior lateral bundle. A portion of the anterior bundle was scarred to the PCL. A portion of the anterior bundle was also torn and debrided. The lateral meniscus was evaluated and found to be torn in the area of the popliteus hiatus, an accessory portal was established to better access the posterior horn of lateral meniscus. We then completed the torn portion of the lateral meniscus . Stable rim was confirmed by probe  The patellofemoral joint was normal  The ArthroCare wand malfunctioned and was not usable  throughout the case.  The knee was irrigated and closed with 3-0 nylon sutures PROCEDURE:  Procedure(s): LEFT KNEE ARTHROSCOPY WITH LATERAL MENISECTOMY (Left)  SURGEON:  Surgeon(s) and Role:    * Vickki Hearing, MD - Primary  PHYSICIAN ASSISTANT:   ASSISTANTS: none   ANESTHESIA:   general  EBL:  Total I/O In: 600 [I.V.:600] Out: -   BLOOD ADMINISTERED:none  DRAINS: none   LOCAL MEDICATIONS USED:  OTHER Marcaine with epinephrine 0.5% total of 60 cc injected into the joint  SPECIMEN:  No Specimen  DISPOSITION OF SPECIMEN:  N/A  COUNTS:  YES  TOURNIQUET:    DICTATION: .Dragon Dictation  PLAN OF CARE: Discharge to home after PACU  PATIENT DISPOSITION:  PACU - hemodynamically stable.   Delay start of Pharmacological VTE agent (>24hrs) due to surgical blood loss or risk of bleeding: not applicable  The patient be weightbearing as tolerated with a walker or crutches.  She will be followed as an outpatient and started on a physical therapy program

## 2013-09-25 ENCOUNTER — Encounter (HOSPITAL_COMMUNITY): Payer: Self-pay | Admitting: Orthopedic Surgery

## 2013-09-25 ENCOUNTER — Ambulatory Visit (INDEPENDENT_AMBULATORY_CARE_PROVIDER_SITE_OTHER): Payer: Medicaid Other | Admitting: Orthopedic Surgery

## 2013-09-25 VITALS — Ht 66.0 in | Wt 292.0 lb

## 2013-09-25 DIAGNOSIS — Z9889 Other specified postprocedural states: Secondary | ICD-10-CM

## 2013-09-25 DIAGNOSIS — M23301 Other meniscus derangements, unspecified lateral meniscus, left knee: Secondary | ICD-10-CM

## 2013-09-25 DIAGNOSIS — S83502D Sprain of unspecified cruciate ligament of left knee, subsequent encounter: Secondary | ICD-10-CM

## 2013-09-25 DIAGNOSIS — M23302 Other meniscus derangements, unspecified lateral meniscus, unspecified knee: Secondary | ICD-10-CM

## 2013-09-25 DIAGNOSIS — Z5189 Encounter for other specified aftercare: Secondary | ICD-10-CM

## 2013-09-25 MED ORDER — HYDROCODONE-ACETAMINOPHEN 10-325 MG PO TABS: 1.0000 | ORAL_TABLET | Freq: Four times a day (QID) | ORAL | Status: DC | PRN

## 2013-09-25 NOTE — Progress Notes (Signed)
Patient ID: Jessica Barry, female   DOB: 03/26/1991, 22 y.o.   MRN: [id number]   Chief Complaint  Patient presents with  . Follow-up    Post op 1 SALK DOS 09/22/13     PRE-OPERATIVE DIAGNOSIS:  Lateral meniscal tear left knee  POST-OPERATIVE DIAGNOSIS:  Lateral meniscal tear left knee  Operative findings: Exam under anesthesia revealed a 1+ Lachman and negative pivot shift stable collateral ligaments stable PCL  Intraoperative findings revealed intact medial meniscus partially torn anterior cruciate ligament with intact posterior lateral fibers of the anterior cruciate ligament, posterior horn medial meniscal tear the popliteus hiatus normal patellofemoral joint   Postop day #3 postop visit #1 Partial anterior cruciate ligament tear lateral meniscal tear status post exam under anesthesia with a negative pivot shift And arthroscopic findings partial anterior cruciate ligament tear  Patient complains of burning pain says medication hydrocodone 10 mg and ibuprofen 800 mg not controlling her pain. She started home exercises yesterday  She is a Medicaid patient, has 3 visits of physical therapy so she will be placed on home exercises and we will follow her in 2 weeks unfortunately she will have to continue with her hydrocodone and ibuprofen and her pain should get better. Her surgical incisions from the 3 portals look fine  Followup 2 weeks Encounter Diagnoses  Name Primary?  . S/P knee surgery Yes  . Sprain of cruciate ligament of knee, left, subsequent encounter   . Meniscus, lateral, derangement, left

## 2013-09-25 NOTE — Patient Instructions (Addendum)
Home exercises   Apply weight as tolerated use the crutches x 1 week

## 2013-10-10 ENCOUNTER — Encounter: Payer: Self-pay | Admitting: Orthopedic Surgery

## 2013-10-10 ENCOUNTER — Ambulatory Visit (INDEPENDENT_AMBULATORY_CARE_PROVIDER_SITE_OTHER): Payer: Self-pay | Admitting: Orthopedic Surgery

## 2013-10-10 VITALS — BP 126/73 | Ht 66.0 in | Wt 292.0 lb

## 2013-10-10 DIAGNOSIS — Z5189 Encounter for other specified aftercare: Secondary | ICD-10-CM

## 2013-10-10 DIAGNOSIS — S83502D Sprain of unspecified cruciate ligament of left knee, subsequent encounter: Secondary | ICD-10-CM

## 2013-10-10 DIAGNOSIS — M23301 Other meniscus derangements, unspecified lateral meniscus, left knee: Secondary | ICD-10-CM

## 2013-10-10 DIAGNOSIS — Z9889 Other specified postprocedural states: Secondary | ICD-10-CM

## 2013-10-10 DIAGNOSIS — M23302 Other meniscus derangements, unspecified lateral meniscus, unspecified knee: Secondary | ICD-10-CM

## 2013-10-10 MED ORDER — HYDROCODONE-ACETAMINOPHEN 7.5-325 MG PO TABS: 1.0000 | ORAL_TABLET | Freq: Four times a day (QID) | ORAL | Status: DC | PRN

## 2013-10-10 NOTE — Progress Notes (Signed)
Patient ID: Jessica Barry, female   DOB: 27-Dec-1991, 22 y.o.   MRN: [id number]  Chief Complaint  Patient presents with  . Follow-up    2 week recheck on left knee SALK. DOS 09-22-13.    Routine postop visit  Follow-up        Post op 1 SALK DOS 09/22/13      PRE-OPERATIVE DIAGNOSIS:  Lateral meniscal tear left knee  POST-OPERATIVE DIAGNOSIS:  Lateral meniscal tear left knee  Operative findings: Exam under anesthesia revealed a 1+ Lachman and negative pivot shift stable collateral ligaments stable PCL  Intraoperative findings revealed intact medial meniscus partially torn anterior cruciate ligament with intact posterior lateral fibers of the anterior cruciate ligament, posterior horn medial meniscal tear the popliteus hiatus normal patellofemoral joint   She is doing a home exercise program should her knee flexion is about 90 compared to 115 on the opposite knee she does have an intact straight leg raise without extensor lag her pain level is mild  She can continue with Norco 7.5 I gave her 2 prescriptions to take 2 weeks apart and enough medication to last her until she comes back in 4 weeks.  She should continue the home exercises working on flexion

## 2013-10-10 NOTE — Patient Instructions (Addendum)
Continue home exercises   Continue ibuprofen and hydrocodone (7.5mg )   Return in 4 weeks

## 2013-11-07 ENCOUNTER — Ambulatory Visit (INDEPENDENT_AMBULATORY_CARE_PROVIDER_SITE_OTHER): Payer: Self-pay | Admitting: Orthopedic Surgery

## 2013-11-07 DIAGNOSIS — Z9889 Other specified postprocedural states: Secondary | ICD-10-CM

## 2013-11-07 DIAGNOSIS — M23302 Other meniscus derangements, unspecified lateral meniscus, unspecified knee: Secondary | ICD-10-CM

## 2013-11-07 DIAGNOSIS — S83502D Sprain of unspecified cruciate ligament of left knee, subsequent encounter: Secondary | ICD-10-CM

## 2013-11-07 DIAGNOSIS — Z5189 Encounter for other specified aftercare: Secondary | ICD-10-CM

## 2013-11-07 DIAGNOSIS — M23301 Other meniscus derangements, unspecified lateral meniscus, left knee: Secondary | ICD-10-CM

## 2013-11-07 MED ORDER — HYDROCODONE-ACETAMINOPHEN 7.5-325 MG PO TABS: 1.0000 | ORAL_TABLET | Freq: Four times a day (QID) | ORAL | Status: DC | PRN

## 2013-11-07 MED ORDER — IBUPROFEN 800 MG PO TABS: 800.0000 mg | ORAL_TABLET | Freq: Three times a day (TID) | ORAL | Status: DC | PRN

## 2013-11-07 NOTE — Patient Instructions (Signed)
Take both medicines   Continue home exercises

## 2013-11-07 NOTE — Progress Notes (Signed)
Patient ID: Jessica Barry, female   DOB: July 23, 1991, 22 y.o.   MRN: [id number] Chief Complaint  Patient presents with  . Follow-up    09-22-2013; salk lat meniscus ; part acl     Encounter Diagnoses  Name Primary?  . S/P knee surgery Yes  . Sprain of cruciate ligament of knee, left, subsequent encounter   . Meniscus, lateral, derangement, left     The patient is undergoing a program for postop knee surgery show partial anterior cruciate ligament tear and had a lateral meniscal tear with lateral meniscectomy and exam under anesthesia.  She's regained flexion 105, her opposite knee is approximately 120.  Because of the Medicaid her recovery process has been slow but she does regain straight leg raise and the knee flexion is described she will continue to work on these as she goes. She will continue with ibuprofen and Norco 7.5 mg.  She does exhibit some knee weakness as she is having difficulty climbing the steps and she is having stiffness as evidence by her lack of knee flexion  Recommend continue with current management followup with me in the next few weeks  Encounter Diagnoses  Name Primary?  . S/P knee surgery Yes  . Sprain of cruciate ligament of knee, left, subsequent encounter   . Meniscus, lateral, derangement, left    Meds ordered this encounter  Medications  . DISCONTD: HYDROcodone-acetaminophen (NORCO) 7.5-325 MG per tablet    Sig: Take 1 tablet by mouth every 6 (six) hours as needed.    Dispense:  90 tablet    Refill:  0  . DISCONTD: ibuprofen (ADVIL,MOTRIN) 800 MG tablet    Sig: Take 1 tablet (800 mg total) by mouth every 8 (eight) hours as needed.    Dispense:  90 tablet    Refill:  5  . DISCONTD: HYDROcodone-acetaminophen (NORCO) 7.5-325 MG per tablet    Sig: Take 1 tablet by mouth every 6 (six) hours as needed.    Dispense:  90 tablet    Refill:  0  . ibuprofen (ADVIL,MOTRIN) 800 MG tablet    Sig: Take 1 tablet (800 mg total) by mouth every 8 (eight) hours  as needed.    Dispense:  90 tablet    Refill:  5  . HYDROcodone-acetaminophen (NORCO) 7.5-325 MG per tablet    Sig: Take 1 tablet by mouth every 6 (six) hours as needed.    Dispense:  90 tablet    Refill:  0

## 2013-12-04 ENCOUNTER — Telehealth: Payer: Self-pay | Admitting: Orthopedic Surgery

## 2013-12-04 ENCOUNTER — Other Ambulatory Visit: Payer: Self-pay | Admitting: Orthopedic Surgery

## 2013-12-04 MED ORDER — HYDROCODONE-ACETAMINOPHEN 5-325 MG PO TABS: 1.0000 | ORAL_TABLET | Freq: Four times a day (QID) | ORAL | Status: AC | PRN

## 2013-12-04 NOTE — Telephone Encounter (Signed)
Routing to Dr Harrison 

## 2013-12-04 NOTE — Telephone Encounter (Signed)
Dr. Romeo Apple refilled and patient was called to pick up prescription.

## 2013-12-04 NOTE — Telephone Encounter (Signed)
Patient called to request refill of medication: Hydrocodone 7.5/325.  Her next scheduled appointment is 02/13/14.  Please advise.  Patient ph# [phone].

## 2014-01-08 ENCOUNTER — Telehealth: Payer: Self-pay | Admitting: Orthopedic Surgery

## 2014-01-08 NOTE — Telephone Encounter (Signed)
Jessica CraftsVanessa Counts request a Hydrocodone prescription

## 2014-01-08 NOTE — Telephone Encounter (Signed)
Routing to Dr Harrison 

## 2014-01-09 ENCOUNTER — Other Ambulatory Visit: Payer: Self-pay | Admitting: *Deleted

## 2014-01-09 DIAGNOSIS — S83502D Sprain of unspecified cruciate ligament of left knee, subsequent encounter: Secondary | ICD-10-CM

## 2014-01-09 MED ORDER — HYDROCODONE-ACETAMINOPHEN 5-325 MG PO TABS
1.0000 | ORAL_TABLET | Freq: Four times a day (QID) | ORAL | Status: DC | PRN
Start: 2014-01-09 — End: 2014-03-01

## 2014-01-09 NOTE — Telephone Encounter (Signed)
Prescription picked up by the patient °

## 2014-01-09 NOTE — Telephone Encounter (Signed)
Called patient, prescription at front desk

## 2014-01-09 NOTE — Telephone Encounter (Signed)
5 mg   # 60

## 2014-02-13 ENCOUNTER — Ambulatory Visit: Payer: Medicaid Other | Admitting: Orthopedic Surgery

## 2014-03-01 ENCOUNTER — Ambulatory Visit (INDEPENDENT_AMBULATORY_CARE_PROVIDER_SITE_OTHER): Payer: Medicaid Other | Admitting: Orthopedic Surgery

## 2014-03-01 ENCOUNTER — Encounter: Payer: Self-pay | Admitting: Orthopedic Surgery

## 2014-03-01 VITALS — BP 122/71 | Ht 66.0 in | Wt 292.0 lb

## 2014-03-01 DIAGNOSIS — Z9889 Other specified postprocedural states: Secondary | ICD-10-CM

## 2014-03-01 DIAGNOSIS — S83509A Sprain of unspecified cruciate ligament of unspecified knee, initial encounter: Secondary | ICD-10-CM

## 2014-03-01 DIAGNOSIS — M23302 Other meniscus derangements, unspecified lateral meniscus, unspecified knee: Secondary | ICD-10-CM

## 2014-03-01 MED ORDER — HYDROCODONE-ACETAMINOPHEN 5-325 MG PO TABS: 1.0000 | ORAL_TABLET | Freq: Four times a day (QID) | ORAL | Status: DC | PRN

## 2014-03-01 NOTE — Patient Instructions (Signed)
Continue exercises

## 2014-03-01 NOTE — Progress Notes (Signed)
Patient ID: Jessica Barry, female   DOB: 12/07/1991, 23 y.o.   MRN: [id number]  Chief Complaint  Patient presents with  . Follow-up    3 month recheck left knee s/p SALK DOS 09/22/13   Encounter Diagnoses  Name Primary?  . S/P knee surgery Yes  . Sprain of cruciate ligament of knee   . Meniscus, lateral, derangement     6 months status post arthroscopy for left knee injury sprain of the cruciate ligament treated nonoperatively she'll set meniscal tear  Her main complaint is inability to climb steps sequentially Review of systems negative  BP 122/71  Ht 5\' 6"  (1.676 m)  Wt 292 lb (132.45 kg)  BMI 47.15 kg/m2 General appearance is normal, the patient is alert and oriented x3 with normal mood and affect.  She's regained full range of motion chest and normal straight leg raise she has a trace amount of laxity in the anteroposterior plane as well as the medial lateral plane  She's advised to continue exercises come back in 6 months continue Norco for pain

## 2014-04-02 ENCOUNTER — Telehealth: Payer: Self-pay | Admitting: Orthopedic Surgery

## 2014-04-02 NOTE — Telephone Encounter (Signed)
Patient called to request refill on pain medication: HYDROcodone-acetaminophen (NORCO/VICODIN) 5-325 MG per tablet States for knee, had arthroscopy in September 2014; last visit 03/01/14, and next scheduled follow up appointment is 09/01/14. Her phone # is [phone].

## 2014-04-03 NOTE — Telephone Encounter (Signed)
Routing to Dr Harrison 

## 2014-04-23 NOTE — Telephone Encounter (Signed)
Approved.  

## 2014-04-23 NOTE — Telephone Encounter (Signed)
Routing to Dr Harrison 

## 2014-04-24 ENCOUNTER — Other Ambulatory Visit: Payer: Self-pay | Admitting: *Deleted

## 2014-04-24 MED ORDER — HYDROCODONE-ACETAMINOPHEN 5-325 MG PO TABS: 1.0000 | ORAL_TABLET | Freq: Four times a day (QID) | ORAL | Status: DC | PRN

## 2014-04-24 NOTE — Telephone Encounter (Signed)
Refilled per DR. Romeo AppleHarrison, and patient informed prescription was ready to be picked up.

## 2014-05-15 ENCOUNTER — Telehealth: Payer: Self-pay | Admitting: Orthopedic Surgery

## 2014-05-15 NOTE — Telephone Encounter (Signed)
Routing to Dr Harrison 

## 2014-05-15 NOTE — Telephone Encounter (Signed)
Patient called to request refill on pain medication,  HYDROcodone-acetaminophen (NORCO/VICODIN) 5-325 MG per tablet [   Her left knee arthroscopy surgery was performed in September of 2014.  Her next follow up appointment is 08/30/14.  Please advise. Her phone # is [phone].

## 2014-05-16 NOTE — Telephone Encounter (Signed)
Declined

## 2014-05-17 NOTE — Telephone Encounter (Signed)
Patient informed. 

## 2014-08-30 ENCOUNTER — Ambulatory Visit: Payer: Medicaid Other | Admitting: Orthopedic Surgery

## 2014-08-30 ENCOUNTER — Encounter: Payer: Self-pay | Admitting: Orthopedic Surgery

## 2017-03-24 ENCOUNTER — Emergency Department (HOSPITAL_COMMUNITY)
Admission: EM | Admit: 2017-03-24 | Discharge: 2017-03-24 | Disposition: A | Discharge status: Home / Self Care | Payer: Medicaid Other | Attending: Emergency Medicine | Admitting: Emergency Medicine

## 2017-03-24 ENCOUNTER — Encounter (HOSPITAL_COMMUNITY): Payer: Self-pay | Admitting: *Deleted

## 2017-03-24 DIAGNOSIS — R51 Headache: Secondary | ICD-10-CM | POA: Insufficient documentation

## 2017-03-24 DIAGNOSIS — R040 Epistaxis: Secondary | ICD-10-CM

## 2017-03-24 LAB — PREGNANCY, URINE: Preg Test, Ur: NEGATIVE

## 2017-03-24 LAB — COMPREHENSIVE METABOLIC PANEL
ALT: 20 U/L (ref 14–54)
ANION GAP: 8 (ref 5–15)
AST: 23 U/L (ref 15–41)
Albumin: 4.1 g/dL (ref 3.5–5.0)
Alkaline Phosphatase: 91 U/L (ref 38–126)
BILIRUBIN TOTAL: 0.7 mg/dL (ref 0.3–1.2)
BUN: 15 mg/dL (ref 6–20)
CO2: 25 mmol/L (ref 22–32)
Calcium: 9.1 mg/dL (ref 8.9–10.3)
Chloride: 103 mmol/L (ref 101–111)
Creatinine, Ser: 0.8 mg/dL (ref 0.44–1.00)
GFR calc Af Amer: >60 mL/min (ref >60)
Glucose, Bld: 72 mg/dL (ref 65–99)
POTASSIUM: 3.8 mmol/L (ref 3.5–5.1)
Sodium: 136 mmol/L (ref 135–145)
TOTAL PROTEIN: 7.7 g/dL (ref 6.5–8.1)

## 2017-03-24 LAB — CBC WITH DIFFERENTIAL/PLATELET
Basophils Absolute: 0 10*3/uL (ref 0.0–0.1)
Basophils Relative: 0 %
Eosinophils Absolute: 0.1 10*3/uL (ref 0.0–0.7)
Eosinophils Relative: 3 %
HCT: 39.3 % (ref 36.0–46.0)
HEMOGLOBIN: 13.3 g/dL (ref 12.0–15.0)
Lymphocytes Relative: 44 %
Lymphs Abs: 2.4 10*3/uL (ref 0.7–4.0)
MCH: 29 pg (ref 26.0–34.0)
MCHC: 33.8 g/dL (ref 30.0–36.0)
MCV: 85.6 fL (ref 78.0–100.0)
Monocytes Absolute: 0.3 10*3/uL (ref 0.1–1.0)
Monocytes Relative: 6 %
NEUTROS ABS: 2.5 10*3/uL (ref 1.7–7.7)
Neutrophils Relative %: 47 %
Platelets: 242 10*3/uL (ref 150–400)
RBC: 4.59 MIL/uL (ref 3.87–5.11)
RDW: 13.8 % (ref 11.5–15.5)
WBC: 5.3 10*3/uL (ref 4.0–10.5)

## 2017-03-24 NOTE — ED Provider Notes (Signed)
[facility] Provider Note   CSN: [id number] Arrival date & time: 03/24/17  1813     History   Chief Complaint Chief Complaint  Patient presents with  . Epistaxis    HPI Jessica Barry is a 26 y.o. female.  HPI Patient resents with left-sided nosebleeds. States she's had for last month and half. Now coming daily. Comes out of left nostril and goes down the back of her throat. No other new bleeding but states she does bleed when she brushes her teeth and does have heavy menses. States she also bruises somewhat easily but states this is chronic for her. Did have one episode of headache yesterday. States she felt better.  Past Medical History:  Diagnosis Date  . Migraine     Patient Active Problem List   Diagnosis Date Noted  . S/P knee surgery 09/25/2013  . Meniscus, lateral, derangement 09/07/2013  . Sprain of cruciate ligament of knee 09/07/2013  . Lateral meniscus tear 09/07/2013  . Lateral meniscal tear 09/07/2013  . Left knee sprain 08/08/2013    Past Surgical History:  Procedure Laterality Date  . KNEE ARTHROSCOPY WITH LATERAL MENISECTOMY Left 09/22/2013   Procedure: LEFT KNEE ARTHROSCOPY WITH LATERAL MENISECTOMY;  Surgeon: Vickki HearingStanley E Harrison, MD;  Location: [facility];  Service: Orthopedics;  Laterality: Left;  . WISDOM TOOTH EXTRACTION      OB History    No data available       Home Medications    Prior to Admission medications   Not on File    Family History No family history on file.  Social History Social History  Substance Use Topics  . Smoking status: Never Smoker  . Smokeless tobacco: Never Used  . Alcohol use No     Allergies   Patient has no known allergies.   Review of Systems Review of Systems  Constitutional: Negative for activity change and appetite change.  HENT: Positive for nosebleeds. Negative for ear discharge and mouth sores.   Eyes: Negative for pain.  Respiratory: Negative for chest tightness and shortness of  breath.   Cardiovascular: Negative for chest pain and leg swelling.  Gastrointestinal: Negative for abdominal pain, diarrhea, nausea and vomiting.  Genitourinary: Negative for flank pain.  Musculoskeletal: Negative for back pain and neck stiffness.  Skin: Negative for rash.  Neurological: Positive for headaches. Negative for weakness and numbness.  Hematological: Does not bruise/bleed easily.  Psychiatric/Behavioral: Negative for behavioral problems.     Physical Exam Updated Vital Signs BP 121/82 (BP Location: Right Arm)   Pulse 88   Temp 98.7 F (37.1 C) (Oral)   Resp 16   Ht 5\' 5"  (1.651 m)   Wt (!) 316 lb (143.3 kg)   LMP 03/13/2017   SpO2 100%   BMI 52.59 kg/m   Physical Exam  Constitutional: She appears well-developed.  HENT:  Head: Atraumatic.  Evidence of previous bleeding and left nostril and posterior pharynx. No active bleeding.  Neck: Neck supple.  Cardiovascular: Normal rate.   Pulmonary/Chest: Effort normal.  Abdominal: Soft. There is no guarding.  Musculoskeletal: She exhibits no edema.  Neurological: She is alert.  Skin: Skin is warm. Capillary refill takes less than 2 seconds.  Psychiatric: She has a normal mood and affect.     ED Treatments / Results  Labs (all labs ordered are listed, but only abnormal results are displayed) Labs Reviewed  COMPREHENSIVE METABOLIC PANEL  CBC WITH DIFFERENTIAL/PLATELET  PREGNANCY, URINE    EKG  EKG Interpretation None  Radiology No results found.  Procedures Procedures (including critical care time)  Medications Ordered in ED Medications - No data to display   Initial Impression / Assessment and Plan / ED Course  I have reviewed the triage vital signs and the nursing notes.  Pertinent labs & imaging results that were available during my care of the patient were reviewed by me and considered in my medical decision making (see chart for details).     Patient with epistaxis. Resolved after  arrival. Unable visualize clear area where the bleeding was. Not actively bleeding in the ER. Labs reassuring. Will have follow-up with ENT.  Final Clinical Impressions(s) / ED Diagnoses   Final diagnoses:  Epistaxis    New Prescriptions There are no discharge medications for this patient.    Benjiman Core, MD 03/25/17 [phone]

## 2017-03-24 NOTE — ED Triage Notes (Signed)
Pt comes in for nose bleeds that have been intermittent over several months. Pt states she gets a nose bleed every other day. Denies any blood thinner use or increased bruising. Pt states she had head pain yesterday but none today.

## 2017-04-26 ENCOUNTER — Encounter (HOSPITAL_COMMUNITY): Payer: Self-pay

## 2017-04-26 ENCOUNTER — Emergency Department (HOSPITAL_COMMUNITY)
Admission: EM | Admit: 2017-04-26 | Discharge: 2017-04-26 | Disposition: A | Discharge status: Home / Self Care | Payer: Medicaid Other | Attending: Emergency Medicine | Admitting: Emergency Medicine

## 2017-04-26 DIAGNOSIS — S50862A Insect bite (nonvenomous) of left forearm, initial encounter: Secondary | ICD-10-CM | POA: Diagnosis not present

## 2017-04-26 DIAGNOSIS — S50861A Insect bite (nonvenomous) of right forearm, initial encounter: Secondary | ICD-10-CM | POA: Diagnosis not present

## 2017-04-26 DIAGNOSIS — S70362A Insect bite (nonvenomous), left thigh, initial encounter: Secondary | ICD-10-CM | POA: Insufficient documentation

## 2017-04-26 DIAGNOSIS — Y999 Unspecified external cause status: Secondary | ICD-10-CM | POA: Diagnosis not present

## 2017-04-26 DIAGNOSIS — W57XXXA Bitten or stung by nonvenomous insect and other nonvenomous arthropods, initial encounter: Secondary | ICD-10-CM | POA: Diagnosis not present

## 2017-04-26 DIAGNOSIS — Y929 Unspecified place or not applicable: Secondary | ICD-10-CM | POA: Insufficient documentation

## 2017-04-26 DIAGNOSIS — Y939 Activity, unspecified: Secondary | ICD-10-CM | POA: Insufficient documentation

## 2017-04-26 MED ORDER — HYDROXYZINE HCL 25 MG PO TABS: 25.0000 mg | ORAL_TABLET | Freq: Four times a day (QID) | ORAL | 0 refills | Status: DC | PRN

## 2017-04-26 NOTE — ED Provider Notes (Signed)
[facility] Provider Note   CSN: [id number] Arrival date & time: 04/26/17  0740     History   Chief Complaint Chief Complaint  Patient presents with  . Rash    HPI Jessica Barry is a 26 y.o. female.  HPI Patient presents with itching rash to bilateral forearms and left thigh. Started on the left forearm and has progressed. Patient states that he noticed the lesion on her left thigh yesterday. It appears as clear bumps which she popped. Had surrounding redness which is now resolved. States that the rash itches. Denies constitutional symptoms. Has been intermittently sleeping at her mother's house. No family members with similar rash. Past Medical History:  Diagnosis Date  . Migraine     Patient Active Problem List   Diagnosis Date Noted  . S/P knee surgery 09/25/2013  . Meniscus, lateral, derangement 09/07/2013  . Sprain of cruciate ligament of knee 09/07/2013  . Lateral meniscus tear 09/07/2013  . Lateral meniscal tear 09/07/2013  . Left knee sprain 08/08/2013    Past Surgical History:  Procedure Laterality Date  . KNEE ARTHROSCOPY WITH LATERAL MENISECTOMY Left 09/22/2013   Procedure: LEFT KNEE ARTHROSCOPY WITH LATERAL MENISECTOMY;  Surgeon: Vickki Hearing, MD;  Location: [facility];  Service: Orthopedics;  Laterality: Left;  . WISDOM TOOTH EXTRACTION      OB History    No data available       Home Medications    Prior to Admission medications   Medication Sig Start Date End Date Taking? Authorizing Provider  hydrOXYzine (ATARAX/VISTARIL) 25 MG tablet Take 1 tablet (25 mg total) by mouth every 6 (six) hours as needed for itching. 04/26/17   Loren Racer, MD    Family History No family history on file.  Social History Social History  Substance Use Topics  . Smoking status: Never Smoker  . Smokeless tobacco: Never Used  . Alcohol use No     Allergies   Patient has no known allergies.   Review of Systems Review of Systems    Constitutional: Negative for chills and fever.  Musculoskeletal: Negative for arthralgias and myalgias.  Skin: Positive for rash.  All other systems reviewed and are negative.    Physical Exam Updated Vital Signs BP (!) 119/41 (BP Location: Right Arm)   Pulse 83   Temp 97.9 F (36.6 C) (Oral)   Resp 18   Ht  (1.651 m)   Wt (!) 320 lb (145.2 kg)   LMP 04/16/2017 (Approximate)   SpO2 99%   BMI 53.25 kg/m   Physical Exam  Constitutional: She is oriented to person, place, and time. She appears well-developed and well-nourished.  HENT:  Head: Normocephalic and atraumatic.  Mouth/Throat: Oropharynx is clear and moist.  Eyes: EOM are normal. Pupils are equal, round, and reactive to light.  Neck: Normal range of motion. Neck supple.  Cardiovascular: Normal rate.   Pulmonary/Chest: Effort normal.  Abdominal: Soft.  Musculoskeletal: Normal range of motion. She exhibits no edema or tenderness.  Neurological: She is alert and oriented to person, place, and time.  Skin: Skin is warm and dry. Capillary refill takes less than 2 seconds. Rash noted. No erythema.  Patient with discrete papules on bilateral forearms. Mild surrounding erythema. No anterior intradigital lesions. In several places the lesions are in a linear pattern. Patient has one small erythematous lesion on her anterior left thigh.   Psychiatric: She has a normal mood and affect. Her behavior is normal.  Nursing note and vitals  reviewed.    ED Treatments / Results  Labs (all labs ordered are listed, but only abnormal results are displayed) Labs Reviewed - No data to display  EKG  EKG Interpretation None       Radiology No results found.  Procedures Procedures (including critical care time)  Medications Ordered in ED Medications - No data to display   Initial Impression / Assessment and Plan / ED Course  I have reviewed the triage vital signs and the nursing notes.  Pertinent labs & imaging  results that were available during my care of the patient were reviewed by me and considered in my medical decision making (see chart for details).     Rash appears most likely due to some type of insect or mite sting or bite. No evidence of secondary infection. Some areas appear to have mild localized allergic reaction associated with the bite. Appears most characteristic of bedbug bites. Does not appear typical of scabies. We'll treat symptomatically and advised having professional exterminators evaluate sleeping areas. Return precautions given.  Final Clinical Impressions(s) / ED Diagnoses   Final diagnoses:  Multiple insect bites    New Prescriptions New Prescriptions   HYDROXYZINE (ATARAX/VISTARIL) 25 MG TABLET    Take 1 tablet (25 mg total) by mouth every 6 (six) hours as needed for itching.     Loren Racer, MD 04/26/17 [phone]

## 2017-04-26 NOTE — ED Triage Notes (Signed)
Pt reports rash to both forearms and left thigh x 1 week.  Reports itching.  Has been using some creams she has for psoriasis but not helping.

## 2017-10-15 ENCOUNTER — Emergency Department (HOSPITAL_COMMUNITY): Payer: Self-pay

## 2017-10-15 ENCOUNTER — Encounter (HOSPITAL_COMMUNITY): Payer: Self-pay | Admitting: Emergency Medicine

## 2017-10-15 ENCOUNTER — Emergency Department (HOSPITAL_COMMUNITY)
Admission: EM | Admit: 2017-10-15 | Discharge: 2017-10-15 | Disposition: A | Discharge status: Home / Self Care | Payer: Self-pay | Attending: Emergency Medicine | Admitting: Emergency Medicine

## 2017-10-15 DIAGNOSIS — R1030 Lower abdominal pain, unspecified: Secondary | ICD-10-CM | POA: Insufficient documentation

## 2017-10-15 LAB — COMPREHENSIVE METABOLIC PANEL
ALBUMIN: 3.8 g/dL (ref 3.5–5.0)
ALT: 18 U/L (ref 14–54)
AST: 19 U/L (ref 15–41)
Alkaline Phosphatase: 87 U/L (ref 38–126)
Anion gap: 9 (ref 5–15)
BILIRUBIN TOTAL: 0.7 mg/dL (ref 0.3–1.2)
BUN: 9 mg/dL (ref 6–20)
CHLORIDE: 102 mmol/L (ref 101–111)
CO2: 28 mmol/L (ref 22–32)
CREATININE: 0.67 mg/dL (ref 0.44–1.00)
Calcium: 9.2 mg/dL (ref 8.9–10.3)
GFR calc Af Amer: >60 mL/min (ref >60)
Glucose, Bld: 105 mg/dL — ABNORMAL HIGH (ref 65–99)
POTASSIUM: 3.7 mmol/L (ref 3.5–5.1)
Sodium: 139 mmol/L (ref 135–145)
TOTAL PROTEIN: 7.4 g/dL (ref 6.5–8.1)

## 2017-10-15 LAB — URINALYSIS, ROUTINE W REFLEX MICROSCOPIC
BILIRUBIN URINE: NEGATIVE
Glucose, UA: NEGATIVE mg/dL
Hgb urine dipstick: NEGATIVE
Ketones, ur: NEGATIVE mg/dL
Nitrite: NEGATIVE
PH: 6 (ref 5.0–8.0)
Protein, ur: NEGATIVE mg/dL
Specific Gravity, Urine: 1.018 (ref 1.005–1.030)

## 2017-10-15 LAB — CBC
HEMATOCRIT: 37.8 % (ref 36.0–46.0)
Hemoglobin: 12.3 g/dL (ref 12.0–15.0)
MCH: 28.4 pg (ref 26.0–34.0)
MCHC: 32.5 g/dL (ref 30.0–36.0)
MCV: 87.3 fL (ref 78.0–100.0)
PLATELETS: 244 10*3/uL (ref 150–400)
RBC: 4.33 MIL/uL (ref 3.87–5.11)
RDW: 13.9 % (ref 11.5–15.5)
WBC: 6.7 10*3/uL (ref 4.0–10.5)

## 2017-10-15 LAB — POC URINE PREG, ED: PREG TEST UR: NEGATIVE

## 2017-10-15 LAB — LIPASE, BLOOD: Lipase: 19 U/L (ref 11–51)

## 2017-10-15 MED ORDER — IBUPROFEN 800 MG PO TABS
800.0000 mg | ORAL_TABLET | Freq: Once | ORAL | Status: DC
  Filled 2017-10-15: qty 1

## 2017-10-15 MED ORDER — TRAMADOL HCL 50 MG PO TABS
100.0000 mg | ORAL_TABLET | Freq: Once | ORAL | Status: DC
  Filled 2017-10-15: qty 2

## 2017-10-15 MED ORDER — IBUPROFEN 600 MG PO TABS: 600.0000 mg | ORAL_TABLET | Freq: Four times a day (QID) | ORAL | 0 refills | Status: DC

## 2017-10-15 MED ORDER — TRAMADOL HCL 50 MG PO TABS: ORAL_TABLET | ORAL | 0 refills | Status: DC

## 2017-10-15 MED ORDER — PROMETHAZINE HCL 12.5 MG PO TABS
12.5000 mg | ORAL_TABLET | Freq: Once | ORAL | Status: DC
  Filled 2017-10-15: qty 1

## 2017-10-15 MED ORDER — IOPAMIDOL (ISOVUE-300) INJECTION 61%
100.0000 mL | Freq: Once | INTRAVENOUS | Status: AC | PRN
  Administered 2017-10-15: 100 mL via INTRAVENOUS

## 2017-10-15 NOTE — ED Provider Notes (Signed)
[facility] EMERGENCY DEPARTMENT Provider Note   CSN: [id number] Arrival date & time: 10/15/17  1430     History   Chief Complaint Chief Complaint  Patient presents with  . Abdominal Pain    HPI Jessica Barry is a 26 y.o. female.  Patient is a 26 year old female who presents to the emergency department with a complaint of abdominal pain. Patient states she was in her usual state of good health until about 1:50 PM today she began to have some stomach pain. This was after she had passed her bowels and her urine and just before she was about to get into the shower. The pain got worse caused her to bend over. She got lightheaded and felt as though she would pass out. Her significant other told her that she was quite pale she went to sit down. She was later able to walk to another area of the room but the stomach pain continued. No reported fever no changes in stools. No blood in the urine. She denies any new foods or medications. She has not been out of the country recently. She denies tobacco products, she denies alcohol on a regular basis. States she only uses it occasionally and on special occasions. Use of drugs reported. The patient states that her last menstrual cycle was approximately a week ago, and it was heavier than usual. She presents now by EMS for assistance with this issue.      Past Medical History:  Diagnosis Date  . Migraine     Patient Active Problem List   Diagnosis Date Noted  . S/P knee surgery 09/25/2013  . Meniscus, lateral, derangement 09/07/2013  . Sprain of cruciate ligament of knee 09/07/2013  . Lateral meniscus tear 09/07/2013  . Lateral meniscal tear 09/07/2013  . Left knee sprain 08/08/2013    Past Surgical History:  Procedure Laterality Date  . KNEE ARTHROSCOPY WITH LATERAL MENISECTOMY Left 09/22/2013   Procedure: LEFT KNEE ARTHROSCOPY WITH LATERAL MENISECTOMY;  Surgeon: Vickki Hearing, MD;  Location: [facility];  Service: Orthopedics;   Laterality: Left;  . WISDOM TOOTH EXTRACTION      OB History    No data available       Home Medications    Prior to Admission medications   Medication Sig Start Date End Date Taking? Authorizing Provider  hydrOXYzine (ATARAX/VISTARIL) 25 MG tablet Take 1 tablet (25 mg total) by mouth every 6 (six) hours as needed for itching. 04/26/17   Loren Racer, MD    Family History History reviewed. No pertinent family history.  Social History Social History  Substance Use Topics  . Smoking status: Never Smoker  . Smokeless tobacco: Never Used  . Alcohol use Yes     Comment: occasionally     Allergies   Patient has no known allergies.   Review of Systems Review of Systems  Constitutional: Positive for activity change. Negative for chills and fever.  HENT: Negative for ear pain and sore throat.   Eyes: Negative for pain and visual disturbance.  Respiratory: Negative for cough and shortness of breath.   Cardiovascular: Negative for chest pain and palpitations.  Gastrointestinal: Positive for abdominal pain. Negative for vomiting.  Genitourinary: Negative for dysuria and hematuria.  Musculoskeletal: Negative for arthralgias and back pain.  Skin: Negative for color change and rash.  Neurological: Positive for light-headedness. Negative for seizures and syncope.  All other systems reviewed and are negative.    Physical Exam Updated Vital Signs BP 121/90 (BP Location:  Right Arm)   Pulse 78   Temp 100 F (37.8 C) (Oral)   Resp 20   Ht 5\' 5"  (1.651 m)   Wt (!) 142.9 kg (315 lb)   LMP 08/29/2017   SpO2 96%   BMI 52.42 kg/m   Physical Exam  Constitutional: She is oriented to person, place, and time. She appears well-developed and well-nourished.  Non-toxic appearance.  HENT:  Head: Normocephalic.  Right Ear: Tympanic membrane and external ear normal.  Left Ear: Tympanic membrane and external ear normal.  Eyes: Pupils are equal, round, and reactive to light. EOM  and lids are normal.  Neck: Normal range of motion. Neck supple. Carotid bruit is not present.  Cardiovascular: Normal rate, regular rhythm, normal heart sounds, intact distal pulses and normal pulses.   Pulmonary/Chest: Breath sounds normal. No respiratory distress.  Abdominal: Soft. Bowel sounds are normal. There is no splenomegaly or hepatomegaly. There is tenderness in the right lower quadrant, periumbilical area, suprapubic area and left lower quadrant. There is no guarding.  Musculoskeletal: Normal range of motion.  Lymphadenopathy:       Head (right side): No submandibular adenopathy present.       Head (left side): No submandibular adenopathy present.    She has no cervical adenopathy.  Neurological: She is alert and oriented to person, place, and time. She has normal strength. No cranial nerve deficit or sensory deficit.  Skin: Skin is warm and dry.  Psychiatric: She has a normal mood and affect. Her speech is normal.  Nursing note and vitals reviewed.    ED Treatments / Results  Labs (all labs ordered are listed, but only abnormal results are displayed) Labs Reviewed  COMPREHENSIVE METABOLIC PANEL - Abnormal; Notable for the following:       Result Value   Glucose, Bld 105 (*)    All other components within normal limits  LIPASE, BLOOD  CBC  URINALYSIS, ROUTINE W REFLEX MICROSCOPIC    EKG  EKG Interpretation None       Radiology No results found.  Procedures Procedures (including critical care time)  Medications Ordered in ED Medications - No data to display   Initial Impression / Assessment and Plan / ED Course  I have reviewed the triage vital signs and the nursing notes.  Pertinent labs & imaging results that were available during my care of the patient were reviewed by me and considered in my medical decision making (see chart for details).      Final Clinical Impressions(s) / ED Diagnoses MDM Temperature is 100, otherwise vital signs within  normal limits. Lipase is normal at 19. Competence of metabolic panel is nonacute. Complete blood count is nonacute.  Patient has pain in the right and left lower quadrant as well as suprapubic area. Will obtain CT scan.  CT scan is negative for acute abdomen or pelvis problem.  Patient will be treated with ibuprofen and Ultram. She is asked to see her GYN specialist for evaluation of this sudden pain involving her abdomen. I discussed the findings of the examination as well as the findings of the lab test with the patient in terms which he understands. Questions were answered.    Final diagnoses:  Lower abdominal pain    New Prescriptions Discharge Medication List as of 10/15/2017  9:12 PM       Ivery QualeBryant, Dee Maday, PA-C 10/16/17 1614    Mesner, Barbara CowerJason, MD 10/18/17 1737

## 2017-10-15 NOTE — Discharge Instructions (Signed)
Your initial temperature was 100, otherwise your vital signs within normal limits. Your pulse oximetry is 96% on room air. Within normal limits. Your complete blood count, your comprehensive chemistry panel, your urine panel, and your urine pregnancy test are all negative. CT scan of your abdomen and pelvis are negative for acute problem.  Please see your GYN specialist or see Dr.Eure for GYN evaluation as a possible cause of your abdominal pain. Please use ibuprofen with breakfast, lunch, dinner, and at bedtime. May use Ultram for more severe pain.This medication may cause drowsiness. Please do not drink, drive, or participate in activity that requires concentration while taking this medication.

## 2017-10-15 NOTE — ED Triage Notes (Signed)
Patient brought in by EMS for complaint of sudden onset of abdominal pain starting today after showering. Denies vomiting or diarrhea.

## 2018-06-07 ENCOUNTER — Emergency Department (HOSPITAL_COMMUNITY)
Admission: EM | Admit: 2018-06-07 | Discharge: 2018-06-08 | Disposition: A | Discharge status: Home / Self Care | Payer: 59 | Attending: Emergency Medicine | Admitting: Emergency Medicine

## 2018-06-07 ENCOUNTER — Emergency Department (HOSPITAL_COMMUNITY): Payer: 59

## 2018-06-07 ENCOUNTER — Encounter (HOSPITAL_COMMUNITY): Payer: Self-pay

## 2018-06-07 ENCOUNTER — Other Ambulatory Visit: Payer: Self-pay

## 2018-06-07 DIAGNOSIS — R0781 Pleurodynia: Secondary | ICD-10-CM | POA: Insufficient documentation

## 2018-06-07 DIAGNOSIS — Z79899 Other long term (current) drug therapy: Secondary | ICD-10-CM | POA: Insufficient documentation

## 2018-06-07 LAB — COMPREHENSIVE METABOLIC PANEL
ALBUMIN: 3.8 g/dL (ref 3.5–5.0)
ALK PHOS: 87 U/L (ref 38–126)
ALT: 14 U/L (ref 14–54)
AST: 15 U/L (ref 15–41)
Anion gap: 7 (ref 5–15)
BILIRUBIN TOTAL: 0.6 mg/dL (ref 0.3–1.2)
BUN: 12 mg/dL (ref 6–20)
CALCIUM: 8.6 mg/dL — AB (ref 8.9–10.3)
CO2: 28 mmol/L (ref 22–32)
Chloride: 103 mmol/L (ref 101–111)
Creatinine, Ser: 0.71 mg/dL (ref 0.44–1.00)
GFR calc Af Amer: >60 mL/min (ref >60)
GFR calc non Af Amer: >60 mL/min (ref >60)
GLUCOSE: 102 mg/dL — AB (ref 65–99)
Potassium: 4.1 mmol/L (ref 3.5–5.1)
Sodium: 138 mmol/L (ref 135–145)
TOTAL PROTEIN: 6.8 g/dL (ref 6.5–8.1)

## 2018-06-07 LAB — CBC WITH DIFFERENTIAL/PLATELET
BASOS ABS: 0 10*3/uL (ref 0.0–0.1)
BASOS PCT: 0 %
Eosinophils Absolute: 0.1 10*3/uL (ref 0.0–0.7)
Eosinophils Relative: 1 %
HCT: 35.7 % — ABNORMAL LOW (ref 36.0–46.0)
Hemoglobin: 11.3 g/dL — ABNORMAL LOW (ref 12.0–15.0)
Lymphocytes Relative: 37 %
Lymphs Abs: 2.6 10*3/uL (ref 0.7–4.0)
MCH: 27.5 pg (ref 26.0–34.0)
MCHC: 31.7 g/dL (ref 30.0–36.0)
MCV: 86.9 fL (ref 78.0–100.0)
MONO ABS: 0.3 10*3/uL (ref 0.1–1.0)
Monocytes Relative: 5 %
Neutro Abs: 4 10*3/uL (ref 1.7–7.7)
Neutrophils Relative %: 57 %
Platelets: 253 10*3/uL (ref 150–400)
RBC: 4.11 MIL/uL (ref 3.87–5.11)
RDW: 14 % (ref 11.5–15.5)
WBC: 7 10*3/uL (ref 4.0–10.5)

## 2018-06-07 LAB — D-DIMER, QUANTITATIVE: D-Dimer, Quant: <0.27 ug/mL-FEU (ref 0.00–0.50)

## 2018-06-07 MED ORDER — HYDROCODONE-ACETAMINOPHEN 5-325 MG PO TABS
1.0000 | ORAL_TABLET | Freq: Once | ORAL | Status: AC
  Administered 2018-06-07: 1 via ORAL
  Filled 2018-06-07: qty 1

## 2018-06-07 NOTE — ED Triage Notes (Signed)
Severe pain in left ribs going into my mid upper back per pt.  Area was swollen and red, never broke out per pt.  Patient's PCP told her he thought she had shingles.  Patient reports that he palpated the area and that was all that he did.  Area noted under patient's left breast to be swollen, no redness, no sores present.  The pain is constant.  Is better after rest, worsens with activity, cough, etc.  Started around one month ago.  No falls or other injuries.  No heavy lifting.

## 2018-06-08 LAB — URINALYSIS, ROUTINE W REFLEX MICROSCOPIC
BILIRUBIN URINE: NEGATIVE
Glucose, UA: NEGATIVE mg/dL
Hgb urine dipstick: NEGATIVE
KETONES UR: NEGATIVE mg/dL
Leukocytes, UA: NEGATIVE
NITRITE: NEGATIVE
PH: 5 (ref 5.0–8.0)
Protein, ur: NEGATIVE mg/dL
Specific Gravity, Urine: 1.024 (ref 1.005–1.030)

## 2018-06-08 LAB — POC URINE PREG, ED: Preg Test, Ur: NEGATIVE

## 2018-06-08 MED ORDER — HYDROCODONE-ACETAMINOPHEN 5-325 MG PO TABS: ORAL_TABLET | ORAL | 0 refills | Status: DC

## 2018-06-08 MED ORDER — DICLOFENAC SODIUM 75 MG PO TBEC: 75.0000 mg | DELAYED_RELEASE_TABLET | Freq: Two times a day (BID) | ORAL | 0 refills | Status: DC

## 2018-06-08 NOTE — Discharge Instructions (Addendum)
Try alternating ice and heat to your chest wall.  Avoid reaching or pulling movements with your left arm.  Follow-up with your doctor or return to ER for any worsening symptoms such as fever, increasing pain, and shortness of breath.

## 2018-06-10 NOTE — ED Provider Notes (Signed)
[facility] EMERGENCY DEPARTMENT Provider Note   CSN: [id number] Arrival date & time: 06/07/18  2054     History   Chief Complaint No chief complaint on file.   HPI Jessica Barry is a 27 y.o. female.  HPI   Jessica Barry is a 27 y.o. female who presents to the Emergency Department complaining of persistent aching pain to her left lower anterior ribs for one month.  She states pain is associated with palpation and certain movements, cough, laughing and deep breathing.  Pain radiates around to her left back.  Feels that area is swollen.  Seen at PCP's office one week ago for same and told she had shingles, but never had significant redness or any rash or blistering.  Pain resolves at rest.  She tried OTC pain relievers without improvement.  She denies injury, fever, chills, cough, and shortness of breath  Past Medical History:  Diagnosis Date  . Migraine     Patient Active Problem List   Diagnosis Date Noted  . S/P knee surgery 09/25/2013  . Meniscus, lateral, derangement 09/07/2013  . Sprain of cruciate ligament of knee 09/07/2013  . Lateral meniscus tear 09/07/2013  . Lateral meniscal tear 09/07/2013  . Left knee sprain 08/08/2013    Past Surgical History:  Procedure Laterality Date  . KNEE ARTHROSCOPY WITH LATERAL MENISECTOMY Left 09/22/2013   Procedure: LEFT KNEE ARTHROSCOPY WITH LATERAL MENISECTOMY;  Surgeon: Vickki Hearing, MD;  Location: [facility];  Service: Orthopedics;  Laterality: Left;  . WISDOM TOOTH EXTRACTION       OB History   None      Home Medications    Prior to Admission medications   Medication Sig Start Date End Date Taking? Authorizing Provider  ibuprofen (ADVIL,MOTRIN) 200 MG tablet Take 200 mg by mouth every 6 (six) hours as needed for headache or moderate pain.   Yes [provider]  diclofenac (VOLTAREN) 75 MG EC tablet Take 1 tablet (75 mg total) by mouth 2 (two) times daily. Take with food 06/08/18   Crystle Carelli, PA-C    HYDROcodone-acetaminophen (NORCO/VICODIN) 5-325 MG tablet Take one tab po q 4 hrs prn pain 06/08/18   Pauline Aus, PA-C    Family History No family history on file.  Social History Social History   Tobacco Use  . Smoking status: Never Smoker  . Smokeless tobacco: Never Used  Substance Use Topics  . Alcohol use: Yes    Comment: occasionally  . Drug use: No     Allergies   Patient has no known allergies.   Review of Systems Review of Systems  Constitutional: Negative for appetite change, chills and fever.  HENT: Negative for congestion and trouble swallowing.   Respiratory: Negative for cough, chest tightness, shortness of breath and wheezing.   Cardiovascular: Negative for chest pain (left rib pain).  Gastrointestinal: Negative for abdominal pain, nausea and vomiting.  Genitourinary: Negative for decreased urine volume, difficulty urinating, dysuria, flank pain and frequency.  Musculoskeletal: Negative for arthralgias.  Skin: Negative for rash.  Neurological: Negative for dizziness, weakness and numbness.  Hematological: Negative for adenopathy.  All other systems reviewed and are negative.    Physical Exam Updated Vital Signs BP 119/78   Pulse 86   Temp 98.1 F (36.7 C) (Oral)   Resp 20   Ht 5\' 5"  (1.651 m)   Wt 136.1 kg (300 lb)   LMP 05/28/2018 (Approximate)   SpO2 99%   BMI 49.92 kg/m   Physical  Exam  Constitutional: She is oriented to person, place, and time. She appears well-nourished. No distress.  HENT:  Head: Atraumatic.  Mouth/Throat: Oropharynx is clear and moist.  Neck: Normal range of motion.  Cardiovascular: Normal rate, regular rhythm and intact distal pulses.  Pulmonary/Chest: Effort normal and breath sounds normal. No respiratory distress. She exhibits tenderness.  Focal ttp of the left lower anterior ribs. No crepitus.  No erythema, rash or edema.   Abdominal: Soft. She exhibits no distension and no mass. There is no tenderness.  There is no rebound and no guarding.  Musculoskeletal: Normal range of motion.  Neurological: She is alert and oriented to person, place, and time. No sensory deficit.  Skin: Skin is warm. Capillary refill takes less than 2 seconds. No rash noted. No erythema.  Psychiatric: She has a normal mood and affect.  Nursing note and vitals reviewed.    ED Treatments / Results  Labs (all labs ordered are listed, but only abnormal results are displayed) Labs Reviewed  COMPREHENSIVE METABOLIC PANEL - Abnormal; Notable for the following components:      Result Value   Glucose, Bld 102 (*)    Calcium 8.6 (*)    All other components within normal limits  CBC WITH DIFFERENTIAL/PLATELET - Abnormal; Notable for the following components:   Hemoglobin 11.3 (*)    HCT 35.7 (*)    All other components within normal limits  URINALYSIS, ROUTINE W REFLEX MICROSCOPIC - Abnormal; Notable for the following components:   APPearance HAZY (*)    All other components within normal limits  D-DIMER, QUANTITATIVE (NOT AT [facility])  POC URINE PREG, ED    EKG None  Radiology Dg Ribs Unilateral W/chest Left  Result Date: 06/07/2018 CLINICAL DATA:  Left chest wall pain for 1 month. EXAM: LEFT RIBS AND CHEST - 3+ VIEW COMPARISON:  None. FINDINGS: No fracture or other bone lesions are seen involving the ribs. There is no evidence of pneumothorax or pleural effusion. Both lungs are clear. Heart size and mediastinal contours are within normal limits. IMPRESSION: Negative radiographs of the chest and left ribs. No explanation for chest wall pain. Electronically Signed   By: Rubye Oaks M.D.   On: 06/07/2018 22:57     Procedures Procedures (including critical care time)  Medications Ordered in ED Medications  HYDROcodone-acetaminophen (NORCO/VICODIN) 5-325 MG per tablet 1 tablet (1 tablet Oral Given 06/07/18 2352)     Initial Impression / Assessment and Plan / ED Course  I have reviewed the triage vital signs  and the nursing notes.  Pertinent labs & imaging results that were available during my care of the patient were reviewed by me and considered in my medical decision making (see chart for details).     Pt well appearing.  Focal rib pain w/o known injury, but admits to repetitive reaching movements.  I am doubtful of PE, D dimer and other labs reassuring. Pain is reproducible with palpation.  Abdomen is soft, NT.  Pt well appearing, vitals reviewed.  Agrees to tx plan and return precautions discussed.    Final Clinical Impressions(s) / ED Diagnoses   Final diagnoses:  Rib pain on left side    ED Discharge Orders        Ordered    diclofenac (VOLTAREN) 75 MG EC tablet  2 times daily     06/08/18 0046    HYDROcodone-acetaminophen (NORCO/VICODIN) 5-325 MG tablet     06/08/18 0046       Dawnna Gritz, Babette Relic, PA-C  06/10/18 0111    Samuel JesterMcManus, Kathleen, DO 06/11/18 2339

## 2019-03-08 DIAGNOSIS — H10413 Chronic giant papillary conjunctivitis, bilateral: Secondary | ICD-10-CM | POA: Diagnosis not present

## 2019-03-08 DIAGNOSIS — H52203 Unspecified astigmatism, bilateral: Secondary | ICD-10-CM | POA: Diagnosis not present

## 2019-06-28 DIAGNOSIS — M791 Myalgia, unspecified site: Secondary | ICD-10-CM | POA: Diagnosis not present

## 2019-06-28 DIAGNOSIS — M2550 Pain in unspecified joint: Secondary | ICD-10-CM | POA: Diagnosis not present

## 2019-06-28 DIAGNOSIS — Z6841 Body Mass Index (BMI) 40.0 and over, adult: Secondary | ICD-10-CM | POA: Diagnosis not present

## 2019-06-28 DIAGNOSIS — G549 Nerve root and plexus disorder, unspecified: Secondary | ICD-10-CM | POA: Diagnosis not present

## 2019-06-28 DIAGNOSIS — L719 Rosacea, unspecified: Secondary | ICD-10-CM | POA: Diagnosis not present

## 2019-08-18 ENCOUNTER — Emergency Department (HOSPITAL_COMMUNITY): Payer: BC Managed Care – PPO

## 2019-08-18 ENCOUNTER — Encounter (HOSPITAL_COMMUNITY): Payer: Self-pay

## 2019-08-18 ENCOUNTER — Emergency Department (HOSPITAL_COMMUNITY)
Admission: EM | Admit: 2019-08-18 | Discharge: 2019-08-18 | Disposition: A | Discharge status: Home / Self Care | Payer: BC Managed Care – PPO | Attending: Emergency Medicine | Admitting: Emergency Medicine

## 2019-08-18 ENCOUNTER — Other Ambulatory Visit: Payer: Self-pay

## 2019-08-18 DIAGNOSIS — Z79899 Other long term (current) drug therapy: Secondary | ICD-10-CM | POA: Diagnosis not present

## 2019-08-18 DIAGNOSIS — M79604 Pain in right leg: Secondary | ICD-10-CM | POA: Diagnosis not present

## 2019-08-18 DIAGNOSIS — M549 Dorsalgia, unspecified: Secondary | ICD-10-CM

## 2019-08-18 DIAGNOSIS — M545 Low back pain: Secondary | ICD-10-CM | POA: Diagnosis not present

## 2019-08-18 DIAGNOSIS — M79661 Pain in right lower leg: Secondary | ICD-10-CM | POA: Diagnosis not present

## 2019-08-18 LAB — POC URINE PREG, ED: Preg Test, Ur: NEGATIVE

## 2019-08-18 MED ORDER — KETOROLAC TROMETHAMINE 10 MG PO TABS
10.0000 mg | ORAL_TABLET | Freq: Once | ORAL | Status: AC
  Administered 2019-08-18: 10 mg via ORAL
  Filled 2019-08-18: qty 1

## 2019-08-18 MED ORDER — ONDANSETRON HCL 4 MG PO TABS
4.0000 mg | ORAL_TABLET | Freq: Once | ORAL | Status: AC
  Administered 2019-08-18: 4 mg via ORAL
  Filled 2019-08-18: qty 1

## 2019-08-18 MED ORDER — PREDNISONE 20 MG PO TABS: 40.0000 mg | ORAL_TABLET | Freq: Every day | ORAL | 0 refills | Status: DC

## 2019-08-18 MED ORDER — IBUPROFEN 800 MG PO TABS
800.0000 mg | ORAL_TABLET | Freq: Once | ORAL | Status: AC
  Administered 2019-08-18: 800 mg via ORAL
  Filled 2019-08-18: qty 1

## 2019-08-18 MED ORDER — DICLOFENAC SODIUM 75 MG PO TBEC: 75.0000 mg | DELAYED_RELEASE_TABLET | Freq: Two times a day (BID) | ORAL | 0 refills | Status: DC

## 2019-08-18 MED ORDER — CYCLOBENZAPRINE HCL 10 MG PO TABS: 10.0000 mg | ORAL_TABLET | Freq: Three times a day (TID) | ORAL | 0 refills | Status: DC

## 2019-08-18 MED ORDER — DEXAMETHASONE SODIUM PHOSPHATE 10 MG/ML IJ SOLN
10.0000 mg | Freq: Once | INTRAMUSCULAR | Status: AC
  Administered 2019-08-18: 10 mg via INTRAMUSCULAR
  Filled 2019-08-18: qty 1

## 2019-08-18 NOTE — ED Triage Notes (Signed)
Pt states she has had intermittent right leg numbness for several months, states now has an area of pain to the outer portion of her right leg along with the numbness.  Pt denies injury or strain, states she was on steroids and pain meds for the same thing in both of her arms previously.

## 2019-08-18 NOTE — ED Provider Notes (Signed)
[facility] EMERGENCY DEPARTMENT Provider Note   CSN: [id number] Arrival date & time: 08/18/19  0605     History   Chief Complaint Chief Complaint  Patient presents with  . Leg numbness    HPI Jessica SkainsVanessa K Barry is a [age] y.o. female.     Patient is a 28 year old female who presents to the emergency department with a complaint of right leg pain and numbness.  The patient states that she has been having problems with her leg for several months.  On last evening it seemed to be getting worse, and this morning she could not get out of the bed for nearly an hour because of the pain and numbness in her right leg.  This is mostly in the thigh area.  She has not had swelling of the right leg versus the left.  She is not had any recent injury to the back or to the leg.  No recent operations or procedures.  Patient states that she is on her feet from 8 to 10 hours a day.  She says she had a similar situation in her upper extremities.  She was placed on steroid medication and anti-inflammatory medication with some improvement.  However she reports that after she finished the medication then she began to have problems in her lower extremities.  She says she has not been evaluated by an orthopedic specialist concerning this.  She reports that she sleeps on her stomach, and on her side.  She has difficulty with breathing if she is on her back.  And she was also concerned that this may have some to do with why she is having some of the numbness, but she says she has numbness and pain on the right side, but not on the left.     Past Medical History:  Diagnosis Date  . Migraine     Patient Active Problem List   Diagnosis Date Noted  . S/P knee surgery 09/25/2013  . Meniscus, lateral, derangement 09/07/2013  . Sprain of cruciate ligament of knee 09/07/2013  . Lateral meniscus tear 09/07/2013  . Lateral meniscal tear 09/07/2013  . Left knee sprain 08/08/2013    Past Surgical History:  Procedure  Laterality Date  . KNEE ARTHROSCOPY WITH LATERAL MENISECTOMY Left 09/22/2013   Procedure: LEFT KNEE ARTHROSCOPY WITH LATERAL MENISECTOMY;  Surgeon: Vickki HearingStanley E Harrison, MD;  Location: [facility];  Service: Orthopedics;  Laterality: Left;  . WISDOM TOOTH EXTRACTION       OB History   No obstetric history on file.      Home Medications    Prior to Admission medications   Medication Sig Start Date End Date Taking? Authorizing Provider  diclofenac (VOLTAREN) 75 MG EC tablet Take 1 tablet (75 mg total) by mouth 2 (two) times daily. Take with food 06/08/18   Triplett, Tammy, PA-C  HYDROcodone-acetaminophen (NORCO/VICODIN) 5-325 MG tablet Take one tab po q 4 hrs prn pain 06/08/18   Triplett, Tammy, PA-C  ibuprofen (ADVIL,MOTRIN) 200 MG tablet Take 200 mg by mouth every 6 (six) hours as needed for headache or moderate pain.    [provider]    Family History No family history on file.  Social History Social History   Tobacco Use  . Smoking status: Never Smoker  . Smokeless tobacco: Never Used  Substance Use Topics  . Alcohol use: Yes    Comment: occasionally  . Drug use: No     Allergies   Patient has no known allergies.  Review of Systems Review of Systems  Constitutional: Negative for activity change.       All ROS Neg except as noted in HPI  HENT: Negative.   Eyes: Negative for photophobia and discharge.  Respiratory: Negative for cough, shortness of breath and wheezing.   Cardiovascular: Negative for chest pain and palpitations.  Gastrointestinal: Negative for abdominal pain and blood in stool.  Genitourinary: Negative for dysuria, frequency and hematuria.  Musculoskeletal: Positive for back pain. Negative for arthralgias and neck pain.       Leg pain  Skin: Negative.   Neurological: Positive for numbness. Negative for dizziness, seizures and speech difficulty.  Psychiatric/Behavioral: Negative for confusion and hallucinations.     Physical Exam Updated  Vital Signs BP (!) 98/57 (BP Location: Right Arm)   Pulse 78   Temp 98.2 F (36.8 C) (Oral)   Resp 18   Ht 5\' 5"  (1.651 m)   Wt (!) 145.2 kg   LMP 08/11/2019   SpO2 98%   BMI 53.25 kg/m   Physical Exam Vitals signs and nursing note reviewed.  Constitutional:      Appearance: She is well-developed. She is not toxic-appearing.  HENT:     Head: Normocephalic.     Right Ear: Tympanic membrane and external ear normal.     Left Ear: Tympanic membrane and external ear normal.  Eyes:     General: Lids are normal.     Pupils: Pupils are equal, round, and reactive to light.  Neck:     Musculoskeletal: Normal range of motion and neck supple.     Vascular: No carotid bruit.  Cardiovascular:     Rate and Rhythm: Normal rate and regular rhythm.     Pulses: Normal pulses.     Heart sounds: Normal heart sounds.  Pulmonary:     Effort: No respiratory distress.     Breath sounds: Normal breath sounds.  Abdominal:     General: Bowel sounds are normal.     Palpations: Abdomen is soft.     Tenderness: There is no abdominal tenderness. There is no guarding.  Musculoskeletal:     Lumbar back: She exhibits pain and spasm.       Back:     Right lower leg: No edema.     Left lower leg: No edema.       Legs:     Comments: There is pain to palpation of the lower lumbar area.  There is no straight leg raise pain.  Dorsalis pedis pulses are 2+.  There are no temperature changes of the right versus the left lower extremity.  Capillary refill is less than 2 seconds.  Lymphadenopathy:     Head:     Right side of head: No submandibular adenopathy.     Left side of head: No submandibular adenopathy.     Cervical: No cervical adenopathy.  Skin:    General: Skin is warm and dry.  Neurological:     Mental Status: She is alert and oriented to person, place, and time.     Cranial Nerves: No cranial nerve deficit.     Sensory: No sensory deficit.     Comments: No numbness in the saddle area.  No  motor or sensory deficits of the lower extremity.  No motor or sensory deficits of the upper extremity.  Psychiatric:        Speech: Speech normal.      ED Treatments / Results  Labs (all labs ordered are listed,  but only abnormal results are displayed) Labs Reviewed  POC URINE PREG, ED    EKG None  Radiology No results found.  Procedures Procedures (including critical care time)  Medications Ordered in ED Medications  dexamethasone (DECADRON) injection 10 mg (has no administration in time range)  ketorolac (TORADOL) tablet 10 mg (has no administration in time range)  ibuprofen (ADVIL) tablet 800 mg (has no administration in time range)  ondansetron (ZOFRAN) tablet 4 mg (has no administration in time range)     Initial Impression / Assessment and Plan / ED Course  I have reviewed the triage vital signs and the nursing notes.  Pertinent labs & imaging results that were available during my care of the patient were reviewed by me and considered in my medical decision making (see chart for details).          Final Clinical Impressions(s) / ED Diagnoses MDM  There is been no recent injury or trauma to the back or to the lower extremity.  This is been going on for several months now.  There is been no loss of bowel or bladder function.  There is been no numbness in the saddle area.  No evidence for cauda equina.  X-ray of the lumbar spine is negative for fracture or dislocation.  The disc spaces are within normal limits.  I have asked the patient to use a heating pad to the lower back.  To rest her back is much as possible.  Prescription for Flexeril, prednisone, and diclofenac given to the patient to use.  Patient referred to orthopedics for additional evaluation and management.   Final diagnoses:  Pain of back and right lower extremity    ED Discharge Orders         Ordered    cyclobenzaprine (FLEXERIL) 10 MG tablet  3 times daily     08/18/19 1039     predniSONE (DELTASONE) 20 MG tablet  Daily,   Status:  Discontinued     08/18/19 1039    diclofenac (VOLTAREN) 75 MG EC tablet  2 times daily     08/18/19 1039    predniSONE (DELTASONE) 20 MG tablet  Daily     08/18/19 1041           Ivery QualeBryant, Gwenivere Hiraldo, PA-C 08/18/19 1102    Bethann BerkshireZammit, Joseph, MD 08/21/19 1628

## 2019-08-18 NOTE — Discharge Instructions (Addendum)
Your neurologic examination is within normal limits.  Your vascular examination of the lower extremities is within normal limits.  Your examination reveals some pain of the lower lumbar area, as well as the side of your right thigh area.  Your x-ray is negative for compression fracture or changes in the disc spaces.  Please see Dr. Griffin Basil or member of his team concerning your back pain, as well as the numbness and pain of your leg.  Please use a heating pad to your back at rest.  Use Flexeril 3 times daily for spasm pain. This medication may cause drowsiness. Please do not drink, drive, or participate in activity that requires concentration while taking this medication.  Use prednisone daily with food.  Use diclofenac 2 times daily with food.

## 2019-08-25 DIAGNOSIS — Z6841 Body Mass Index (BMI) 40.0 and over, adult: Secondary | ICD-10-CM | POA: Diagnosis not present

## 2019-08-25 DIAGNOSIS — M5416 Radiculopathy, lumbar region: Secondary | ICD-10-CM | POA: Diagnosis not present

## 2019-09-08 DIAGNOSIS — M6281 Muscle weakness (generalized): Secondary | ICD-10-CM | POA: Diagnosis not present

## 2019-09-08 DIAGNOSIS — M799 Soft tissue disorder, unspecified: Secondary | ICD-10-CM | POA: Diagnosis not present

## 2019-09-08 DIAGNOSIS — M25551 Pain in right hip: Secondary | ICD-10-CM | POA: Diagnosis not present

## 2019-09-08 DIAGNOSIS — M25651 Stiffness of right hip, not elsewhere classified: Secondary | ICD-10-CM | POA: Diagnosis not present

## 2019-09-13 DIAGNOSIS — M6281 Muscle weakness (generalized): Secondary | ICD-10-CM | POA: Diagnosis not present

## 2019-09-13 DIAGNOSIS — M25651 Stiffness of right hip, not elsewhere classified: Secondary | ICD-10-CM | POA: Diagnosis not present

## 2019-09-13 DIAGNOSIS — M25551 Pain in right hip: Secondary | ICD-10-CM | POA: Diagnosis not present

## 2019-09-13 DIAGNOSIS — M799 Soft tissue disorder, unspecified: Secondary | ICD-10-CM | POA: Diagnosis not present

## 2019-09-14 DIAGNOSIS — M6281 Muscle weakness (generalized): Secondary | ICD-10-CM | POA: Diagnosis not present

## 2019-09-14 DIAGNOSIS — M25551 Pain in right hip: Secondary | ICD-10-CM | POA: Diagnosis not present

## 2019-09-14 DIAGNOSIS — M25651 Stiffness of right hip, not elsewhere classified: Secondary | ICD-10-CM | POA: Diagnosis not present

## 2019-09-14 DIAGNOSIS — M799 Soft tissue disorder, unspecified: Secondary | ICD-10-CM | POA: Diagnosis not present

## 2019-10-05 DIAGNOSIS — M5410 Radiculopathy, site unspecified: Secondary | ICD-10-CM | POA: Diagnosis not present

## 2019-10-05 DIAGNOSIS — M545 Low back pain: Secondary | ICD-10-CM | POA: Diagnosis not present

## 2019-10-05 DIAGNOSIS — Z6841 Body Mass Index (BMI) 40.0 and over, adult: Secondary | ICD-10-CM | POA: Diagnosis not present

## 2019-10-05 DIAGNOSIS — G5713 Meralgia paresthetica, bilateral lower limbs: Secondary | ICD-10-CM | POA: Diagnosis not present

## 2019-10-05 DIAGNOSIS — Z23 Encounter for immunization: Secondary | ICD-10-CM | POA: Diagnosis not present

## 2019-11-15 DIAGNOSIS — E538 Deficiency of other specified B group vitamins: Secondary | ICD-10-CM | POA: Diagnosis not present

## 2019-11-15 DIAGNOSIS — M792 Neuralgia and neuritis, unspecified: Secondary | ICD-10-CM | POA: Diagnosis not present

## 2019-11-15 DIAGNOSIS — E559 Vitamin D deficiency, unspecified: Secondary | ICD-10-CM | POA: Diagnosis not present

## 2019-11-15 DIAGNOSIS — Z681 Body mass index (BMI) 19 or less, adult: Secondary | ICD-10-CM | POA: Diagnosis not present

## 2019-11-15 DIAGNOSIS — E039 Hypothyroidism, unspecified: Secondary | ICD-10-CM | POA: Diagnosis not present

## 2019-11-15 DIAGNOSIS — M545 Low back pain: Secondary | ICD-10-CM | POA: Diagnosis not present

## 2019-11-15 DIAGNOSIS — M5410 Radiculopathy, site unspecified: Secondary | ICD-10-CM | POA: Diagnosis not present

## 2020-01-01 ENCOUNTER — Other Ambulatory Visit: Payer: BC Managed Care – PPO

## 2020-01-07 ENCOUNTER — Emergency Department (HOSPITAL_COMMUNITY): Payer: BC Managed Care – PPO

## 2020-01-07 ENCOUNTER — Other Ambulatory Visit: Payer: Self-pay

## 2020-01-07 ENCOUNTER — Emergency Department (HOSPITAL_COMMUNITY)
Admission: EM | Admit: 2020-01-07 | Discharge: 2020-01-07 | Disposition: A | Discharge status: Home / Self Care | Payer: BC Managed Care – PPO | Attending: Emergency Medicine | Admitting: Emergency Medicine

## 2020-01-07 ENCOUNTER — Encounter (HOSPITAL_COMMUNITY): Payer: Self-pay | Admitting: *Deleted

## 2020-01-07 DIAGNOSIS — R2243 Localized swelling, mass and lump, lower limb, bilateral: Secondary | ICD-10-CM | POA: Diagnosis not present

## 2020-01-07 DIAGNOSIS — M7989 Other specified soft tissue disorders: Secondary | ICD-10-CM

## 2020-01-07 DIAGNOSIS — R609 Edema, unspecified: Secondary | ICD-10-CM | POA: Diagnosis not present

## 2020-01-07 DIAGNOSIS — R0602 Shortness of breath: Secondary | ICD-10-CM | POA: Insufficient documentation

## 2020-01-07 DIAGNOSIS — R600 Localized edema: Secondary | ICD-10-CM | POA: Diagnosis not present

## 2020-01-07 LAB — BASIC METABOLIC PANEL
Anion gap: 11 (ref 5–15)
BUN: 7 mg/dL (ref 6–20)
CO2: 22 mmol/L (ref 22–32)
Calcium: 8.4 mg/dL — ABNORMAL LOW (ref 8.9–10.3)
Chloride: 104 mmol/L (ref 98–111)
Creatinine, Ser: 0.63 mg/dL (ref 0.44–1.00)
GFR calc Af Amer: >60 mL/min (ref >60)
GFR calc non Af Amer: >60 mL/min (ref >60)
Glucose, Bld: 106 mg/dL — ABNORMAL HIGH (ref 70–99)
Potassium: 4.5 mmol/L (ref 3.5–5.1)
Sodium: 137 mmol/L (ref 135–145)

## 2020-01-07 LAB — CBC
HCT: 36.4 % (ref 36.0–46.0)
Hemoglobin: 11.3 g/dL — ABNORMAL LOW (ref 12.0–15.0)
MCH: 28.3 pg (ref 26.0–34.0)
MCHC: 31 g/dL (ref 30.0–36.0)
MCV: 91 fL (ref 80.0–100.0)
Platelets: 235 10*3/uL (ref 150–400)
RBC: 4 MIL/uL (ref 3.87–5.11)
RDW: 14.2 % (ref 11.5–15.5)
WBC: 5.5 10*3/uL (ref 4.0–10.5)
nRBC: 0 % (ref 0.0–0.2)

## 2020-01-07 LAB — BRAIN NATRIURETIC PEPTIDE: B Natriuretic Peptide: 29 pg/mL (ref 0.0–100.0)

## 2020-01-07 LAB — I-STAT BETA HCG BLOOD, ED (MC, WL, AP ONLY): I-stat hCG, quantitative: <5 m[IU]/mL (ref <5)

## 2020-01-07 MED ORDER — GABAPENTIN 300 MG PO CAPS: 300.0000 mg | ORAL_CAPSULE | Freq: Three times a day (TID) | ORAL | 0 refills | Status: DC

## 2020-01-07 NOTE — ED Triage Notes (Signed)
Patient presents to the ED with bilateral lower leg swelling for 5 months.  Patient has been seen by her [facility] in [place] for this issue and states nothing has been done.

## 2020-01-07 NOTE — Discharge Instructions (Signed)
Your work-up in the emergency department today has been reassuring.  We recommend diet modification; try to limit salt intake.  You would also benefit from use of compression stockings.  You have been given a prescription for gabapentin to take for management of pain.  Follow-up with a primary care doctor to ensure resolution of symptoms.

## 2020-01-07 NOTE — ED Provider Notes (Signed)
[facility] Provider Note   CSN: [id number] Arrival date & time: 01/07/20  0930     History Chief Complaint  Patient presents with  . Leg Swelling    bilateral    Jessica Barry is a 29 y.o. female.   29 year old female with a history of migraine headaches presents to the emergency department for evaluation of bilateral lower extremity swelling x 5 months.  She states that swelling is worse at the end of the day as well as in the morning.  She notices that it improves a little bit throughout the day when she is active and on her feet.  Has tried to watch her salt intake, but endorses poor diet at baseline.  Symptoms have been associated with paresthesias in her bilateral feet which make it difficult for her to sleep at nighttime.  She has followed up with her primary care for this and been placed on multiple courses of medications including meloxicam, prednisone, cyclobenzaprine, Celebrex without relief.  Does report some mild associated SOB as well as more recent weight gain.  No fevers, syncope, lower extremity erythema, hemoptysis, trauma/injury.  The history is provided by the patient. No language interpreter was used.       Past Medical History:  Diagnosis Date  . Migraine     Patient Active Problem List   Diagnosis Date Noted  . S/P knee surgery 09/25/2013  . Meniscus, lateral, derangement 09/07/2013  . Sprain of cruciate ligament of knee 09/07/2013  . Lateral meniscus tear 09/07/2013  . Lateral meniscal tear 09/07/2013  . Left knee sprain 08/08/2013    Past Surgical History:  Procedure Laterality Date  . KNEE ARTHROSCOPY WITH LATERAL MENISECTOMY Left 09/22/2013   Procedure: LEFT KNEE ARTHROSCOPY WITH LATERAL MENISECTOMY;  Surgeon: Carole Civil, MD;  Location: [facility];  Service: Orthopedics;  Laterality: Left;  . WISDOM TOOTH EXTRACTION       OB History   No obstetric history on file.     History reviewed. No pertinent family  history.  Social History   Tobacco Use  . Smoking status: Never Smoker  . Smokeless tobacco: Never Used  Substance Use Topics  . Alcohol use: Yes    Comment: occasionally  . Drug use: No    Home Medications Prior to Admission medications   Medication Sig Start Date End Date Taking? Authorizing Provider  gabapentin (NEURONTIN) 300 MG capsule Take 1 capsule (300 mg total) by mouth 3 (three) times daily. 01/07/20   Antonietta Breach, PA-C    Allergies    Patient has no known allergies.  Review of Systems   Review of Systems  Ten systems reviewed and are negative for acute change, except as noted in the HPI.    Physical Exam Updated Vital Signs BP (!) 117/50   Pulse 84   Temp 98.3 F (36.8 C) (Oral)   Resp 17   Ht 5\' 5"  (1.651 m)   Wt (!) 149.7 kg   LMP 12/12/2019   SpO2 100%   BMI 54.91 kg/m   Physical Exam Vitals and nursing note reviewed.  Constitutional:      General: She is not in acute distress.    Appearance: She is well-developed. She is not diaphoretic.     Comments: Morbidly obese female.  HENT:     Head: Normocephalic and atraumatic.  Eyes:     General: No scleral icterus.    Conjunctiva/sclera: Conjunctivae normal.  Cardiovascular:     Rate and Rhythm: Normal  rate and regular rhythm.     Pulses: Normal pulses.     Comments: DP pulse 2+ bilaterally Pulmonary:     Effort: Pulmonary effort is normal. No respiratory distress.     Comments: Respirations even and unlabored Musculoskeletal:        General: Normal range of motion.     Cervical back: Normal range of motion.     Comments: Trace BLE edema. No heat to touch, lymphangitic streaking, induration.  Skin:    General: Skin is warm and dry.     Coloration: Skin is not pale.     Findings: No erythema or rash.  Neurological:     Mental Status: She is alert and oriented to person, place, and time.     Comments: Moving all extremities and transitioning in the bed without assistance.  Psychiatric:          Behavior: Behavior normal.     ED Results / Procedures / Treatments   Labs (all labs ordered are listed, but only abnormal results are displayed) Labs Reviewed  BASIC METABOLIC PANEL - Abnormal; Notable for the following components:      Result Value   Glucose, Bld 106 (*)    Calcium 8.4 (*)    All other components within normal limits  CBC - Abnormal; Notable for the following components:   Hemoglobin 11.3 (*)    All other components within normal limits  BRAIN NATRIURETIC PEPTIDE  I-STAT BETA HCG BLOOD, ED ([facility], [facility], [facility] ONLY)    EKG None  Radiology US Venous Img Lower Bilateral (DVT)  Result Date: 01/07/2020 CLINICAL DATA:  Lower extremity edema EXAM: BILATERAL LOWER EXTREMITY VENOUS DUPLEX ULTRASOUND TECHNIQUE: Gray-scale sonography with graded compression, as well as color Doppler and duplex ultrasound were performed to evaluate the lower extremity deep venous systems from the level of the common femoral vein and including the common femoral, femoral, profunda femoral, popliteal and calf veins including the posterior tibial, peroneal and gastrocnemius veins when visible. The superficial great saphenous vein was also interrogated. Spectral Doppler was utilized to evaluate flow at rest and with distal augmentation maneuvers in the common femoral, femoral and popliteal veins. COMPARISON:  None. FINDINGS: RIGHT LOWER EXTREMITY Common Femoral Vein: No evidence of thrombus. Normal compressibility, respiratory phasicity and response to augmentation. Saphenofemoral Junction: No evidence of thrombus. Normal compressibility and flow on color Doppler imaging. Profunda Femoral Vein: No evidence of thrombus. Normal compressibility and flow on color Doppler imaging. Femoral Vein: No evidence of thrombus. Normal compressibility, respiratory phasicity and response to augmentation. Popliteal Vein: No evidence of thrombus. Normal compressibility, respiratory phasicity and response to augmentation.  Calf Veins: No evidence of thrombus. Normal compressibility and flow on color Doppler imaging. Superficial Great Saphenous Vein: No evidence of thrombus. Normal compressibility. Venous Reflux:  None. Other Findings:  None. LEFT LOWER EXTREMITY Common Femoral Vein: No evidence of thrombus. Normal compressibility, respiratory phasicity and response to augmentation. Saphenofemoral Junction: No evidence of thrombus. Normal compressibility and flow on color Doppler imaging. Profunda Femoral Vein: No evidence of thrombus. Normal compressibility and flow on color Doppler imaging. Femoral Vein: No evidence of thrombus. Normal compressibility, respiratory phasicity and response to augmentation. Popliteal Vein: No evidence of thrombus. Normal compressibility, respiratory phasicity and response to augmentation. Calf Veins: No evidence of thrombus. Normal compressibility and flow on color Doppler imaging. Superficial Great Saphenous Vein: No evidence of thrombus. Normal compressibility. Venous Reflux:  None. Other Findings:  None. IMPRESSION: No evidence of deep venous thrombosis in either  lower extremity. Electronically Signed   By: Bretta Bang III M.D.   On: 01/07/2020 12:40    Procedures Procedures (including critical care time)  Medications Ordered in ED Medications - No data to display  ED Course  I have reviewed the triage vital signs and the nursing notes.  Pertinent labs & imaging results that were available during my care of the patient were reviewed by me and considered in my medical decision making (see chart for details).    MDM Rules/Calculators/A&P                       29 year old female presenting to the emergency department for evaluation of 5 months of bilateral lower extremity edema.  She has trace edema in bilateral lower extremities.  Is otherwise neurovascularly intact.  Suspect symptoms to be secondary to dependent edema.  This is likely related to obesity and physical  deconditioning as well as poor diet.  Evaluation in the emergency department has been reassuring.  She has no electrolyte derangements.  Pregnancy is negative.  Markers also checked for heart failure which are normal.  Patient further had bilateral venous duplex ultrasounds performed.  No evidence of DVT on imaging.  Plan for continued outpatient follow-up.  As patient complains of burning and paresthesias in her bilateral lower extremities, will start on gabapentin for pain control.  She has previously been on multiple anti-inflammatories without relief.  Return precautions discussed and provided. Patient discharged in stable condition with no unaddressed concerns.   Final Clinical Impression(s) / ED Diagnoses Final diagnoses:  Peripheral edema    Rx / DC Orders ED Discharge Orders         Ordered    gabapentin (NEURONTIN) 300 MG capsule  3 times daily     01/07/20 1311           Antony Madura, PA-C 01/07/20 1516    Vanetta Mulders, MD 01/19/20 1510

## 2020-01-17 ENCOUNTER — Other Ambulatory Visit: Payer: Self-pay

## 2020-01-17 ENCOUNTER — Ambulatory Visit: Payer: BC Managed Care – PPO | Admitting: Neurology

## 2020-01-17 ENCOUNTER — Encounter: Payer: Self-pay | Admitting: Neurology

## 2020-01-17 VITALS — BP 124/75 | HR 73 | Temp 97.3°F | Ht 65.0 in | Wt 341.0 lb

## 2020-01-17 DIAGNOSIS — R200 Anesthesia of skin: Secondary | ICD-10-CM | POA: Diagnosis not present

## 2020-01-17 DIAGNOSIS — M79605 Pain in left leg: Secondary | ICD-10-CM

## 2020-01-17 DIAGNOSIS — R29898 Other symptoms and signs involving the musculoskeletal system: Secondary | ICD-10-CM | POA: Diagnosis not present

## 2020-01-17 DIAGNOSIS — R350 Frequency of micturition: Secondary | ICD-10-CM

## 2020-01-17 DIAGNOSIS — R599 Enlarged lymph nodes, unspecified: Secondary | ICD-10-CM

## 2020-01-17 DIAGNOSIS — M79604 Pain in right leg: Secondary | ICD-10-CM

## 2020-01-17 DIAGNOSIS — M545 Low back pain: Secondary | ICD-10-CM | POA: Diagnosis not present

## 2020-01-17 DIAGNOSIS — R609 Edema, unspecified: Secondary | ICD-10-CM

## 2020-01-17 DIAGNOSIS — M7989 Other specified soft tissue disorders: Secondary | ICD-10-CM

## 2020-01-17 DIAGNOSIS — G8929 Other chronic pain: Secondary | ICD-10-CM

## 2020-01-17 DIAGNOSIS — R102 Pelvic and perineal pain: Secondary | ICD-10-CM

## 2020-01-17 NOTE — Progress Notes (Signed)
[facility]    Provider:  Dr Lucia Gaskins Requesting Provider: [facility] Assunta Found MD Primary Care Provider:  [facility]  CC:  leg swelling and pain  HPI:  Jessica Barry is a 29 y.o. female here as requested by [facility] for right leg swelling and pain. Started the end of August, no inciting events, no falls, she was asleep on her stomach and she woke up because her leg was completely numb the whole thigh was asleep right leg, she coul dnot move, she had severe pain and weakness, she "was screaming", She went to the Emergency room, she had leg swelling, her legs were numb. She had a rash months before this incident but no similar symptoms or pain or swelling or numbness anytime prior to the acute onset. She had a rash. The swelling worsened following this ED visit, no evidence of DVT 01/07/2020, she shows me a rash on both legs below the knee medial and she was told it was fungus (appears patchy with serpentine borders lower medial lower leg, papular macular, never went past the knee and the swelling came up to the knee. She has seen 3 doctors, she has had bloodwork but otherwise no MRI, no other imaging other than XR lumbar spine 07/2019 and [place] lower which did not show DVT. The rash has changed, the legs staying red below the knees, she has stiffness and burning sensation in the calves, she has low back pain, shooting into her legs from the low back, can't even lay down without symptoms, weakness, she may be having some increased urination but no bowel changes. No associated upper arm or face/head symptoms. She went to PT no help, tried OTC medications, been under the care of 3 separate doctors for > 6 months, symptoms are progressively getting worse with worsening pain, swelling, no bleeding disorders, no clotting disorders, she has pelvic pain in her lower pelvis bilaterally, legs are hot constantly. She has rosacea and she has  blushing of the cheeks which is chronic and not associated with the new symptoms. Currently no numbness or tingling in the legs.  Reviewed notes, labs and imaging from outside physicians, which showed:   Dg lumbar spine 08/18/2019: reviewed images and agree There is no evidence of lumbar spine fracture. Alignment is normal. Intervertebral disc spaces are maintained.  IMPRESSION: Negative.  US venous DVT: IMPRESSION: reviewed report No evidence of deep venous thrombosis in either lower extremity.  BUN/Creat normal  Review of Systems: Patient complains of symptoms per HPI as well as the following symptoms: obesity, swelling, rash. Pertinent negatives and positives per HPI. All others negative.   Social History   Socioeconomic History  . Marital status: Single    Spouse name: Not on file  . Number of children: 1  . Years of education: Not on file  . Highest education level: High school graduate  Occupational History  . Not on file  Tobacco Use  . Smoking status: Never Smoker  . Smokeless tobacco: Never Used  Substance and Sexual Activity  . Alcohol use: Yes    Comment: occasionally  . Drug use: No  . Sexual activity: Not on file  Other Topics Concern  . Not on file  Social History Narrative   Lives at home with her daughter   Right handed   Caffeine: minimal    Social Determinants of Health   Financial Resource Strain:   . Difficulty of Paying Living Expenses: Not on  file  Food Insecurity:   . Worried About Programme researcher, broadcasting/film/video in the Last Year: Not on file  . Ran Out of Food in the Last Year: Not on file  Transportation Needs:   . Lack of Transportation (Medical): Not on file  . Lack of Transportation (Non-Medical): Not on file  Physical Activity:   . Days of Exercise per Week: Not on file  . Minutes of Exercise per Session: Not on file  Stress:   . Feeling of Stress : Not on file  Social Connections:   . Frequency of Communication with Friends and Family:  Not on file  . Frequency of Social Gatherings with Friends and Family: Not on file  . Attends Religious Services: Not on file  . Active Member of Clubs or Organizations: Not on file  . Attends Banker Meetings: Not on file  . Marital Status: Not on file  Intimate Partner Violence:   . Fear of Current or Ex-Partner: Not on file  . Emotionally Abused: Not on file  . Physically Abused: Not on file  . Sexually Abused: Not on file    Family History  Problem Relation Age of Onset  . CAD Mother        triple bypass  . Epilepsy Other     Past Medical History:  Diagnosis Date  . Epilepsy (HCC)    as a child; "I grew out of it"  . Lumbago   . Migraine   . Neuralgia   . Radicular pain     Patient Active Problem List   Diagnosis Date Noted  . Edema, peripheral 01/17/2020  . S/P knee surgery 09/25/2013  . Meniscus, lateral, derangement 09/07/2013  . Sprain of cruciate ligament of knee 09/07/2013  . Lateral meniscus tear 09/07/2013  . Lateral meniscal tear 09/07/2013  . Left knee sprain 08/08/2013    Past Surgical History:  Procedure Laterality Date  . KNEE ARTHROSCOPY WITH LATERAL MENISECTOMY Left 09/22/2013   Procedure: LEFT KNEE ARTHROSCOPY WITH LATERAL MENISECTOMY;  Surgeon: Vickki Hearing, MD;  Location: [facility];  Service: Orthopedics;  Laterality: Left;  . MENISCUS REPAIR Left   . WISDOM TOOTH EXTRACTION      Current Outpatient Medications  Medication Sig Dispense Refill  . gabapentin (NEURONTIN) 300 MG capsule Take 1 capsule (300 mg total) by mouth 3 (three) times daily. 45 capsule 0   No current facility-administered medications for this visit.    Allergies as of 01/17/2020  . (No Known Allergies)    Vitals: BP 124/75 (BP Location: Right Arm, Patient Position: Sitting, Cuff Size: Large)   Pulse 73   Temp (!) 97.3 F (36.3 C) Comment: taken at front  Ht 5\' 5"  (1.651 m)   Wt (!) 341 lb (154.7 kg)   LMP 01/17/2020 (Within Days)   BMI 56.75  kg/m  Last Weight:  Wt Readings from Last 1 Encounters:  01/17/20 (!) 341 lb (154.7 kg)   Last Height:   Ht Readings from Last 1 Encounters:  01/17/20 5\' 5"  (1.651 m)     Physical exam: Exam: Gen: NAD, conversant, well nourised, morbidly obese, well groomed                     CV: RRR, no MRG. No Carotid Bruits. No peripheral edema, warm, nontender Eyes: Conjunctivae clear without exudates or hemorrhage  Neuro: Detailed Neurologic Exam  Speech:    Speech is normal; fluent and spontaneous with normal comprehension.  Cognition:  The patient is oriented to person, place, and time;     recent and remote memory intact;     language fluent;     normal attention, concentration,     fund of knowledge Cranial Nerves:    The pupils are equal, round, and reactive to light. The fundi are flat. Visual fields are full to finger confrontation. Extraocular movements are intact. Trigeminal sensation is intact and the muscles of mastication are normal. The face is symmetric. The palate elevates in the midline. Hearing intact. Voice is normal. Shoulder shrug is normal. The tongue has normal motion without fasciculations.   Coordination:    Normal finger to nose  Gait:    Wide based due to extremely large body habitus  Motor Observation:    No asymmetry, no atrophy, and no involuntary movements noted. Tone:    Normal muscle tone.    Posture:    Posture is normal. normal erect    Strength:    Strength is V/V in the upper and lower limbs.      Sensation: intact to LT     Reflex Exam:  DTR's:    Deep tendon reflexes in the upper and lower extremities are normal and symmetric bilaterally.   Toes:    The toes are downgoing bilaterally.   Clonus:    Clonus is absent.    Assessment/Plan: This is a very nice morbidly obese patient who comes in with a history that is very curious of what she says is acute onset leg pain, rash, swelling in both the legs.  On examination she has  pitting edema up to both knees, legs are warm, neurologic exam completely normal including strength and reflexes.  I think an MRI of the lumbar spine is prudent given some of the history she gives such as prior numbness in the legs, back pain, changes in bladder, subjective weakness,so I do think it is worthwhile to check an MRI to make sure there is no cauda equina or some other high-level etiology however would be extremely unusual for radiculopathy to cause acute swelling bilat.  She had ultrasounds of her lower extremities which were negative for DVT.  She has been advised by several doctors as this is likely lymphedema and morbid obesity, I spoke to the wonderful Dr. Sherren Mocha early who is vascular physician  who also agrees sounds like lymphedema and acute onset swelling is a curious complaint and it would be very unlikely to be a more proximal etiology but he did say that a CT abdomen pelvis with contrast venous phase could rule out any proximal venous occlusion or compressive mass however unlikely.  I spent 90 minutes with patient, discussing with vascular, also consulting with my own neurologic colleagues who also agree with the above that this would be very unusual to be lumbar radiculopathy causing pain and swelling but cannot rule out concomitant lumbar radic/myelopathy.  I will order the CT abdomen pelvis and the MRI of the lumbar spine to rule out other etiologies and if negative unfortunately patient will have to go back to her primary care doctors for treatment of lymphedema.  CT abdomen and pelvis spoke to Dr. Donnetta Hutching to see if venous occlusion proximally or compresive mass. CTA/P with contrast only venouse phase  MRI lumbar spine for eval of radic or high-level lumbar myelopathy/cauda equina  Orders Placed This Encounter  Procedures  . MR LUMBAR SPINE WO CONTRAST  . CT ABDOMEN PELVIS W CONTRAST   No orders of the defined types  were placed in this encounter.   Cc: [facility]   Assunta Found, MD  Naomie Dean, MD  [facility] [address] [place], [place] [address]  Phone [phone] Fax [phone]

## 2020-01-17 NOTE — Patient Instructions (Signed)
MRI lumbar spine CT abdomen/pelvis venous phase  Lymphedema  Lymphedema is swelling that is caused by the abnormal collection of lymph in the tissues under the skin. Lymph is fluid from the tissues in your body that is removed through the lymphatic system. This system is part of your body's defense system (immune system) and includes lymph nodes and lymph vessels. The lymph vessels collect and carry the excess fluid, fats, proteins, and wastes from the tissues of the body to the bloodstream. This system also works to clean and remove bacteria and waste products from the body. Lymphedema occurs when the lymphatic system is blocked. When the lymph vessels or lymph nodes are blocked or damaged, lymph does not drain properly. This causes an abnormal buildup of lymph, which leads to swelling in the affected area. This may include the trunk area, or an arm or leg. Lymphedema cannot be cured by medicines, but various methods can be used to help reduce the swelling. There are two types of lymphedema: primary lymphedema and secondary lymphedema. What are the causes? The cause of this condition depends on the type of lymphedema that you have.  Primary lymphedema is caused by the absence of lymph vessels or having abnormal lymph vessels at birth.  Secondary lymphedema occurs when lymph vessels are blocked or damaged. Secondary lymphedema is more common. Common causes of lymph vessel blockage include: ? Skin infection, such as cellulitis. ? Infection by parasites (filariasis). ? Injury. ? Radiation therapy. ? Cancer. ? Formation of scar tissue. ? Surgery. What are the signs or symptoms? Symptoms of this condition include:  Swelling of the arm or leg.  A heavy or tight feeling in the arm or leg.  Swelling of the feet, toes, or fingers. Shoes or rings may fit more tightly than before.  Redness of the skin over the affected area.  Limited movement of the affected limb.  Sensitivity to touch or  discomfort in the affected limb. How is this diagnosed? This condition may be diagnosed based on:  Your symptoms and medical history.  A physical exam.  Bioimpedance spectroscopy. In this test, painless electrical currents are used to measure fluid levels in your body.  Imaging tests, such as: ? Lymphoscintigraphy. In this test, a low dose of a radioactive substance is injected to trace the flow of lymph through the lymph vessels. ? MRI. ? CT scan. ? Duplex ultrasound. This test uses sound waves to produce images of the vessels and the blood flow on a screen. ? Lymphangiography. In this test, a contrast dye is injected into the lymph vessel to help show blockages. How is this treated? Treatment for this condition may depend on the cause of your lymphedema. Treatment may include:  Complete decongestive therapy (CDT). This is done by a certified lymphedema therapist to reduce fluid congestion. This therapy includes: ? Manual lymph drainage. This is a special massage technique that promotes lymph drainage out of a limb. ? Skin care. ? Compression wrapping of the affected area. ? Specific exercises. Certain exercises can help fluid move out of the affected limb.  Compression. Various methods may be used to apply pressure to the affected limb to reduce the swelling. They include: ? Wearing compression stockings or sleeves on the affected limb. ? Wrapping the affected limb with special bandages.  Surgery. This is usually done for severe cases only. For example, surgery may be done if you have trouble moving the limb or if the swelling does not get better with other treatments. If  an underlying condition is causing the lymphedema, treatment for that condition will be done. For example, antibiotic medicines may be used to treat an infection. Follow these instructions at home: Self-care  The affected area is more likely to become injured or infected. Take these steps to help prevent  infection: ? Keep the affected area clean and dry. ? Use approved creams or lotions to keep the skin moisturized. ? Protect your skin from cuts:  Use gloves while cooking or gardening.  Do not walk barefoot.  If you shave the affected area, use an Copy.  Do not wear tight clothes, shoes, or jewelry.  Eat a healthy diet that includes a lot of fruits and vegetables. Activity  Exercise regularly as directed by your health care provider.  Do not sit with your legs crossed.  When possible, keep the affected limb raised (elevated) above the level of your heart.  Avoid carrying things with an arm that is affected by lymphedema. General instructions  Wear compression stockings or sleeves as told by your health care provider.  Note any changes in size of the affected limb. You may be instructed to take regular measurements and keep track of them.  Take over-the-counter and prescription medicines only as told by your health care provider.  If you were prescribed an antibiotic medicine, take or apply it as told by your health care provider. Do not stop using the antibiotic even if you start to feel better.  Do not use heating pads or ice packs over the affected area.  Avoid having blood draws, IV insertions, or blood pressure checked on the affected limb.  Keep all follow-up visits as told by your health care provider. This is important. Contact a health care provider if you:  Continue to have swelling in your limb.  Have a cut that does not heal.  Have redness or pain in the affected area. Get help right away if you:  Have new swelling in your limb that comes on suddenly.  Develop purplish spots, rash or sores (lesions) on your affected limb.  Have shortness of breath.  Have a fever or chills. Summary  Lymphedema is swelling that is caused by the abnormal collection of lymph in the tissues under the skin.  Lymph is fluid from the tissues in your body that is  removed through the lymphatic system. This system collects and carries excess fluid, fats, proteins, and wastes from the tissues of the body to the bloodstream.  Lymphedema causes swelling, pain, and redness in the affected area. This may include the trunk area, or an arm or leg.  Treatment for this condition may depend on the cause of your lymphedema. Treatment may include complete decongestive therapy (CDT), compression methods, surgery, or treating the underlying cause. This information is not intended to replace advice given to you by your health care provider. Make sure you discuss any questions you have with your health care provider. Document Revised: 12/27/2017 Document Reviewed: 12/27/2017 Elsevier Patient Education  2020 [employer].

## 2020-01-31 ENCOUNTER — Telehealth: Payer: Self-pay | Admitting: Neurology

## 2020-01-31 NOTE — Telephone Encounter (Signed)
[employer] pending faxed notes.  

## 2020-02-01 NOTE — Telephone Encounter (Signed)
[employer] Auth: [id number] (exp. 01/31/20 to 07/28/20) ordre faxed to [facility] they will reach out to the patient to schedule

## 2020-02-01 NOTE — Telephone Encounter (Signed)
scheduled at [facility] for 02/07/20

## 2020-02-07 DIAGNOSIS — M5126 Other intervertebral disc displacement, lumbar region: Secondary | ICD-10-CM | POA: Diagnosis not present

## 2020-02-07 DIAGNOSIS — M47816 Spondylosis without myelopathy or radiculopathy, lumbar region: Secondary | ICD-10-CM | POA: Diagnosis not present

## 2020-02-07 DIAGNOSIS — R102 Pelvic and perineal pain: Secondary | ICD-10-CM | POA: Diagnosis not present

## 2020-02-07 DIAGNOSIS — R590 Localized enlarged lymph nodes: Secondary | ICD-10-CM | POA: Diagnosis not present

## 2020-02-07 DIAGNOSIS — M7989 Other specified soft tissue disorders: Secondary | ICD-10-CM | POA: Diagnosis not present

## 2020-02-08 ENCOUNTER — Encounter: Payer: Self-pay | Admitting: *Deleted

## 2020-02-08 ENCOUNTER — Telehealth: Payer: Self-pay | Admitting: Neurology

## 2020-02-08 DIAGNOSIS — R9389 Abnormal findings on diagnostic imaging of other specified body structures: Secondary | ICD-10-CM

## 2020-02-08 NOTE — Telephone Encounter (Signed)
Pt called wanting to know about her MRI results. Please advise.

## 2020-02-08 NOTE — Telephone Encounter (Signed)
Essentially normal, some very minimal normal for age arthritic changes absolutely nothing that would cause her symptoms. We did discuss it was very unlikely her low back. The CT Abdomen and Pelvis was unremarkable but it did show some mild flattened appearance of the common iliac arteries. Unfortunately as she knows this is not my area of expertise but I ordered the imaging to try and help I will have to refer her to vascular for evaluation if she is ok with tht, she should bring the report and CD which she can pick up here at the office.

## 2020-02-08 NOTE — Telephone Encounter (Signed)
I called the pt and LVM (ok per DPR) advising her of the results of the mri lumbar spine & CT A&P in details per Dr. Trevor Mace message below. I asked for a call back or mychart message to let us know if she is ok with the referral to vascular and also if she has any questions. Advised I would send a mychart message as well with the results. Left office number in message and advised office closing at 5 pm today. Calls received tomorrow AM will be returned on Monday.

## 2020-02-08 NOTE — Telephone Encounter (Signed)
Results are under review by Dr. Lucia Gaskins. I message pt with update in mychart.

## 2020-02-19 NOTE — Addendum Note (Signed)
Addended by: Bertram Savin on: 02/19/2020 01:28 PM   Modules accepted: Orders

## 2020-02-19 NOTE — Telephone Encounter (Signed)
Pt called back and states that she is ok with the referral to vascular. Please advise.

## 2020-04-15 ENCOUNTER — Encounter: Payer: Self-pay | Admitting: Surgery

## 2020-04-15 ENCOUNTER — Ambulatory Visit (INDEPENDENT_AMBULATORY_CARE_PROVIDER_SITE_OTHER): Payer: BC Managed Care – PPO | Admitting: Surgery

## 2020-04-15 ENCOUNTER — Other Ambulatory Visit: Payer: Self-pay

## 2020-04-15 VITALS — BP 115/77 | HR 77 | Temp 97.6°F | Resp 20 | Ht 65.0 in | Wt 332.0 lb

## 2020-04-15 DIAGNOSIS — R600 Localized edema: Secondary | ICD-10-CM

## 2020-04-15 NOTE — Progress Notes (Signed)
[facility] [place]  Patient name: Jessica Barry MRN: [id number] DOB: 1991/05/05 Sex: female   REQUESTING PROVIDER:    Dr. Jaynee Eagles   REASON FOR CONSULT:    ? Iliac artery stenosis  HISTORY OF PRESENT ILLNESS:   Jessica Barry is a 29 y.o. female, who is referred for evaluations of leg pain.  She states that she has been having difficulty for the past several months.  She states that 1 night she woke up from her sleep with burning and pain in her thigh and later developed significant swelling.  She had a venous ultrasound that was negative for DVT.  She underwent a CT scan that comments that she had a flattened appearance of her common iliac veins.  She also had a MRI that showed mild lumbar spondylosis at L4-L5.  She is here today continuing to complain of pain.  She is having to sleep sitting up because of the swelling and hardness in her legs.  PAST MEDICAL HISTORY    Past Medical History:  Diagnosis Date  . Epilepsy ([place])    as a child; "I grew out of it"  . Lumbago   . Migraine   . Neuralgia   . Radicular pain      FAMILY HISTORY   Family History  Problem Relation Age of Onset  . CAD Mother        triple bypass  . Epilepsy Other     SOCIAL HISTORY:   Social History   Socioeconomic History  . Marital status: Single    Spouse name: Not on file  . Number of children: 1  . Years of education: Not on file  . Highest education level: High school graduate  Occupational History  . Not on file  Tobacco Use  . Smoking status: Never Smoker  . Smokeless tobacco: Never Used  Substance and Sexual Activity  . Alcohol use: Yes    Comment: occasionally  . Drug use: No  . Sexual activity: Not on file  Other Topics Concern  . Not on file  Social History Narrative   Lives at home with her daughter   Right handed   Caffeine: minimal    Social Determinants of Health   Financial Resource Strain:   .  Difficulty of Paying Living Expenses:   Food Insecurity:   . Worried About Charity fundraiser in the Last Year:   . Arboriculturist in the Last Year:   Transportation Needs:   . Film/video editor (Medical):   Marland Kitchen Lack of Transportation (Non-Medical):   Physical Activity:   . Days of Exercise per Week:   . Minutes of Exercise per Session:   Stress:   . Feeling of Stress :   Social Connections:   . Frequency of Communication with Friends and Family:   . Frequency of Social Gatherings with Friends and Family:   . Attends Religious Services:   . Active Member of Clubs or Organizations:   . Attends Archivist Meetings:   Marland Kitchen Marital Status:   Intimate Partner Violence:   . Fear of Current or Ex-Partner:   . Emotionally Abused:   Marland Kitchen Physically Abused:   . Sexually Abused:     ALLERGIES:    No Known Allergies  CURRENT MEDICATIONS:    No current outpatient medications on file.   No current facility-administered medications for this visit.    REVIEW OF SYSTEMS:   [X]  denotes positive finding, [ ]   denotes negative finding Cardiac  Comments:  Chest pain or chest pressure:    Shortness of breath upon exertion: x   Short of breath when lying flat:    Irregular heart rhythm:        Vascular    Pain in calf, thigh, or hip brought on by ambulation: x   Pain in feet at night that wakes you up from your sleep:     Blood clot in your veins:    Leg swelling:  x       Pulmonary    Oxygen at home:    Productive cough:     Wheezing:         Neurologic    Sudden weakness in arms or legs:     Sudden numbness in arms or legs:     Sudden onset of difficulty speaking or slurred speech:    Temporary loss of vision in one eye:     Problems with dizziness:         Gastrointestinal    Blood in stool:      Vomited blood:         Genitourinary    Burning when urinating:     Blood in urine:        Psychiatric    Major depression:         Hematologic    Bleeding  problems:    Problems with blood clotting too easily:        Skin    Rashes or ulcers:        Constitutional    Fever or chills:     PHYSICAL EXAM:   Vitals:   04/15/20 0952  BP: 115/77  Pulse: 77  Resp: 20  Temp: 97.6 F (36.4 C)  SpO2: 97%  Weight: (!) 332 lb (150.6 kg)  Height: 5\' 5"  (1.651 m)    GENERAL: The patient is a well-nourished female, in no acute distress. The vital signs are documented above. CARDIAC: There is a regular rate and rhythm.  VASCULAR: Palpable pedal pulses.  Significant bilateral lower extremity nonpitting edema PULMONARY: Nonlabored respirations MUSCULOSKELETAL: There are no major deformities or cyanosis. NEUROLOGIC: No focal weakness or paresthesias are detected. SKIN: There are no ulcers or rashes noted. PSYCHIATRIC: The patient has a normal affect.  STUDIES:   I have reviewed her venous ultrasound from January 2021 that shows no evidence of DVT.  I have seen the reports from her CT scan which shows a flattened appearance of her iliac veins  ASSESSMENT and PLAN   Bilateral leg swelling: I suspect this is lymphedema and not a venous pathology.  I have recommended placing her in compression stockings to see if this helps alleviate some of her symptoms.  I am also making a referral to lymphedema therapy.  She will return for venous reflux imaging to see if she has saphenous reflux   February 2021, IV, MD, FACS [facility] [phone] Pager [phone]

## 2020-04-17 ENCOUNTER — Other Ambulatory Visit: Payer: Self-pay | Admitting: *Deleted

## 2020-04-17 DIAGNOSIS — R600 Localized edema: Secondary | ICD-10-CM

## 2020-04-19 DIAGNOSIS — M79605 Pain in left leg: Secondary | ICD-10-CM | POA: Diagnosis not present

## 2020-04-19 DIAGNOSIS — I890 Lymphedema, not elsewhere classified: Secondary | ICD-10-CM | POA: Diagnosis not present

## 2020-05-15 DIAGNOSIS — Z6841 Body Mass Index (BMI) 40.0 and over, adult: Secondary | ICD-10-CM | POA: Diagnosis not present

## 2020-05-15 DIAGNOSIS — Z01419 Encounter for gynecological examination (general) (routine) without abnormal findings: Secondary | ICD-10-CM | POA: Diagnosis not present

## 2020-05-23 ENCOUNTER — Ambulatory Visit (HOSPITAL_COMMUNITY): Payer: BC Managed Care – PPO | Attending: Family Medicine | Admitting: Physical Therapy

## 2020-05-23 ENCOUNTER — Other Ambulatory Visit: Payer: Self-pay

## 2020-05-23 ENCOUNTER — Encounter (HOSPITAL_COMMUNITY): Payer: Self-pay | Admitting: Physical Therapy

## 2020-05-23 DIAGNOSIS — R600 Localized edema: Secondary | ICD-10-CM | POA: Insufficient documentation

## 2020-05-23 DIAGNOSIS — M79605 Pain in left leg: Secondary | ICD-10-CM

## 2020-05-23 DIAGNOSIS — M79604 Pain in right leg: Secondary | ICD-10-CM | POA: Diagnosis not present

## 2020-05-23 NOTE — Therapy (Signed)
[facility] [address] [place], [place], [address] Phone: [phone]   Fax:  [phone]  Physical Therapy Evaluation  Patient Details  Name: Jessica Barry MRN: [id number] Date of Birth: 24-Dec-1991 Referring Provider (PT): Terie Purser    Encounter Date: 05/23/2020  PT End of Session - 05/23/20 1109    Visit Number  1    Number of Visits  12    Date for PT Re-Evaluation  07/04/20   wont begin for two weeks   Authorization - Visit Number  4    Authorization - Number of Visits  30    Progress Note Due on Visit  10    PT Start Time  1000    PT Stop Time  1115    PT Time Calculation (min)  75 min    Activity Tolerance  Patient tolerated treatment well    Behavior During Therapy  [facility] for tasks assessed/performed       Past Medical History:  Diagnosis Date  . Epilepsy ([facility])    as a child; "I grew out of it"  . Lumbago   . Migraine   . Neuralgia   . Radicular pain     Past Surgical History:  Procedure Laterality Date  . KNEE ARTHROSCOPY WITH LATERAL MENISECTOMY Left 09/22/2013   Procedure: LEFT KNEE ARTHROSCOPY WITH LATERAL MENISECTOMY;  Surgeon: Vickki Hearing, MD;  Location: [facility];  Service: Orthopedics;  Laterality: Left;  . MENISCUS REPAIR Left   . WISDOM TOOTH EXTRACTION      There were no vitals filed for this visit.   Subjective Assessment - 05/23/20 1006    Subjective  Ms. Maroney states that she has had swell in both legs for years.  She has not been officially  diagnosed with lymphedema.  She states that towards the end of August she woke up screaming in pain, it felt like they were asleep but 10x worse.  She went to the ER who stated that it was swelling, and they could not do anything for her pain.  They did x-ray, MRI, CT several specialist.  She went to PT and hand and rehab for back PT and it made her worse.  She has compression hose and wears them when she works.  She is on a fluid pill, when she stops  taking it she does not see a lot of difference.    How long can you sit comfortably?  only if she is on a chair that has arm rests    How long can you stand comfortably?  constantly standing/walking at work    How long can you walk comfortably?  constantly staning and walking at work helps the fluid    Patient Stated Goals  less pain    Currently in Pain?  No/denies         [facility] PT Assessment - 05/23/20 0001      Assessment   Medical Diagnosis  edema in B LE     Referring Provider (PT)  Terie Purser     Onset Date/Surgical Date  07/29/19    Prior Therapy  Hand and Rehab for her back       Precautions   Precautions  None      Restrictions   Weight Bearing Restrictions  No      Balance Screen   Has the patient fallen in the past 6 months  No      Prior Function   Level of  Independence  Independent      Cognition   Overall Cognitive Status  Within Functional Limits for tasks assessed      Observation/Other Assessments-Edema    Edema  --   noted LE edema; B LE are red, hot and indurated.        LYMPHEDEMA/ONCOLOGY QUESTIONNAIRE - 05/23/20 1021      What other symptoms do you have   Are you Having Heaviness or Tightness  Yes    Are you having Pain  No   not now only hurts, tingles and burns when she's laying down   Are you having pitting edema  Yes    Body Site  legs     Is it Hard or Difficult finding clothes that fit  Yes   normally obest    Do you have infections  --   not now,      Lymphedema Stage   Stage  STAGE 2 SPONTANEOUSLY IRREVERSIBLE      Lymphedema Assessments   Lymphedema Assessments  Lower extremities      Right Lower Extremity Lymphedema   20 cm Proximal to Suprapatella  92 cm    10 cm Proximal to Suprapatella  78 cm    At Midpatella/Popliteal Crease  55.6 cm    30 cm Proximal to Floor at Lateral Plantar Foot  56.3 cm    20 cm Proximal to Floor at Lateral Plantar Foot  39.3 1    10  cm Proximal to Floor at Lateral Malleoli  30.3 cm     Circumference of ankle/heel  35.4 cm.    5 cm Proximal to 1st MTP Joint  24 cm    Across MTP Joint  24.6 cm    Around Proximal Great Toe  9.1 cm      Left Lower Extremity Lymphedema   20 cm Proximal to Suprapatella  89.5 cm    10 cm Proximal to Suprapatella  77.4 cm    At Midpatella/Popliteal Crease  54.5 cm    30 cm Proximal to Floor at Lateral Plantar Foot  55.8 cm    20 cm Proximal to Floor at Lateral Plantar Foot  42.8 cm    10 cm Proximal to Floor at Lateral Malleoli  31 cm    Circumference of ankle/heel  36 cm.    5 cm Proximal to 1st MTP Joint  24.5 cm    Across MTP Joint  24.4 cm    Around Proximal Great Toe  8.7 cm               Objective measurements completed on examination: See above findings.              PT Education - 05/23/20 1108    Education Details  HEP , Tips for lymphema, self massage    Person(s) Educated  Patient    Methods  Explanation;Demonstration;Tactile cues;Verbal cues;Handout    Comprehension  Verbalized understanding;Returned demonstration       PT Short Term Goals - 05/23/20 1126      PT SHORT TERM GOAL #1   Title  PT to be able to verbalize sign and sx of cellulitis and that it is imperative to get antibiotics    Time  3   Pt will not be able to start treatment for at least 2 weeks   Period  Weeks    Status  New    Target Date  06/21/20      PT SHORT TERM GOAL #2  Title  Pt to be able to verbalize the 4 components for optimizing results in lymphedema care, skin care, exercise, comprssion therapy and lymph drainage.    Time  3    Period  Weeks    Status  New      PT SHORT TERM GOAL #3   Title  PT to have decreased 2 cm volume of LE to reduce pain level to no greater than a 5/10 and to reduce the risk of cellulitis.    Time  3    Period  Weeks    Status  New        PT Long Term Goals - 05/23/20 1129      PT LONG TERM GOAL #1   Title  Pt to have obtained and have used her compression pump to assist in lymph  drainage    Time  6   Pt will not be able to start for 2 weeks   Period  Weeks    Status  New    Target Date  07/04/20      PT LONG TERM GOAL #2   Title  Pt to be I in self massage    Time  6    Period  Weeks    Status  New      PT LONG TERM GOAL #3   Title  PT to verbalize and be donning compression garment daily    Time  6    Period  Weeks    Status  New      PT LONG TERM GOAL #4   Title  PT LE volumes to be reduced from 3-4 cm to allow pain to decrease to no greater than a 3/10 to allow pt to sleep in her bed.    Time  6    Period  Weeks    Status  New             Plan - 05/23/20 1116    Clinical Impression Statement  Ms. Fowers is a 29 yo female who has been referred to skilled PT for bilateral lymphedema.  The pt has edema B but her history does not coincide with true lymphedema.  Her LE are dry , red and indurated B with less sx in her thigh region.  She does have compression knee high garments but currently she is only wearing them when she goes to work.  The therapist did place her on a compression pump for 20 minutes with noted reduction of swelling, therefore, we will treat as lymphedema in hopes to decrease her swelling and pain.    Personal Factors and Comorbidities  Comorbidity 1    Comorbidities  morbid obesity    Examination-Activity Limitations  Sleep;Locomotion Level    Examination-Participation Restrictions  Community Activity;Shop    Stability/Clinical Decision Making  Evolving/Moderate complexity    Clinical Decision Making  Moderate    Rehab Potential  Good    PT Frequency  3x / week    PT Duration  4 weeks    PT Treatment/Interventions  ADLs/Self Care Home Management;Therapeutic exercise;Patient/family education;Manual lymph drainage;Compression bandaging    PT Next Visit Plan  cut foam and begin total decongestive techniques for B LE from toe to knee high if pt tolerates begin thigh high.  Review self manual techniques.    PT Home Exercise Plan   ankle pump, LAQ, hip ab/adduction, marching and diaphragmic breathing, Self manual techniques 1-8    Consulted and Agree with Plan of Care  Patient       Patient will benefit from skilled therapeutic intervention in order to improve the following deficits and impairments:  Pain, Obesity, Increased edema, Decreased skin integrity  Visit Diagnosis: Localized edema  Pain in right leg  Pain in left leg     Problem List Patient Active Problem List   Diagnosis Date Noted  . Edema, peripheral 01/17/2020  . S/P knee surgery 09/25/2013  . Meniscus, lateral, derangement 09/07/2013  . Sprain of cruciate ligament of knee 09/07/2013  . Lateral meniscus tear 09/07/2013  . Lateral meniscal tear 09/07/2013  . Left knee sprain 08/08/2013    Virgina Organ, PT CLT [phone] 05/23/2020, 11:37 AM  [facility] [address] [place], [place], [address] Phone: [phone]   Fax:  [phone]  Name: AILIN ROCHFORD MRN: [id number] Date of Birth: 18-Jun-1991

## 2020-07-03 DIAGNOSIS — M79672 Pain in left foot: Secondary | ICD-10-CM | POA: Diagnosis not present

## 2020-07-03 DIAGNOSIS — B353 Tinea pedis: Secondary | ICD-10-CM | POA: Diagnosis not present

## 2020-07-03 DIAGNOSIS — M79671 Pain in right foot: Secondary | ICD-10-CM | POA: Diagnosis not present

## 2020-07-11 ENCOUNTER — Emergency Department (HOSPITAL_COMMUNITY): Payer: BC Managed Care – PPO

## 2020-07-11 ENCOUNTER — Encounter (HOSPITAL_COMMUNITY): Payer: Self-pay | Admitting: Emergency Medicine

## 2020-07-11 ENCOUNTER — Emergency Department (HOSPITAL_COMMUNITY)
Admission: EM | Admit: 2020-07-11 | Discharge: 2020-07-11 | Disposition: A | Discharge status: Home / Self Care | Payer: BC Managed Care – PPO | Attending: Emergency Medicine | Admitting: Emergency Medicine

## 2020-07-11 ENCOUNTER — Other Ambulatory Visit: Payer: Self-pay

## 2020-07-11 DIAGNOSIS — R0789 Other chest pain: Secondary | ICD-10-CM | POA: Insufficient documentation

## 2020-07-11 DIAGNOSIS — R1011 Right upper quadrant pain: Secondary | ICD-10-CM

## 2020-07-11 DIAGNOSIS — R7401 Elevation of levels of liver transaminase levels: Secondary | ICD-10-CM

## 2020-07-11 DIAGNOSIS — R1013 Epigastric pain: Secondary | ICD-10-CM | POA: Diagnosis not present

## 2020-07-11 DIAGNOSIS — R109 Unspecified abdominal pain: Secondary | ICD-10-CM | POA: Diagnosis not present

## 2020-07-11 DIAGNOSIS — R079 Chest pain, unspecified: Secondary | ICD-10-CM

## 2020-07-11 LAB — BASIC METABOLIC PANEL
Anion gap: 10 (ref 5–15)
BUN: 14 mg/dL (ref 6–20)
CO2: 25 mmol/L (ref 22–32)
Calcium: 8.5 mg/dL — ABNORMAL LOW (ref 8.9–10.3)
Chloride: 104 mmol/L (ref 98–111)
Creatinine, Ser: 0.7 mg/dL (ref 0.44–1.00)
GFR calc Af Amer: >60 mL/min (ref >60)
GFR calc non Af Amer: >60 mL/min (ref >60)
Glucose, Bld: 116 mg/dL — ABNORMAL HIGH (ref 70–99)
Potassium: 3.6 mmol/L (ref 3.5–5.1)
Sodium: 139 mmol/L (ref 135–145)

## 2020-07-11 LAB — CBC
HCT: 39.7 % (ref 36.0–46.0)
Hemoglobin: 12.6 g/dL (ref 12.0–15.0)
MCH: 27.5 pg (ref 26.0–34.0)
MCHC: 31.7 g/dL (ref 30.0–36.0)
MCV: 86.5 fL (ref 80.0–100.0)
Platelets: 259 10*3/uL (ref 150–400)
RBC: 4.59 MIL/uL (ref 3.87–5.11)
RDW: 15 % (ref 11.5–15.5)
WBC: 11.2 10*3/uL — ABNORMAL HIGH (ref 4.0–10.5)
nRBC: 0 % (ref 0.0–0.2)

## 2020-07-11 LAB — HEPATIC FUNCTION PANEL
ALT: 76 U/L — ABNORMAL HIGH (ref 0–44)
AST: 139 U/L — ABNORMAL HIGH (ref 15–41)
Albumin: 3.8 g/dL (ref 3.5–5.0)
Alkaline Phosphatase: 104 U/L (ref 38–126)
Bilirubin, Direct: 0.1 mg/dL (ref 0.0–0.2)
Indirect Bilirubin: 0.5 mg/dL (ref 0.3–0.9)
Total Bilirubin: 0.6 mg/dL (ref 0.3–1.2)
Total Protein: 6.7 g/dL (ref 6.5–8.1)

## 2020-07-11 LAB — TROPONIN I (HIGH SENSITIVITY)
Troponin I (High Sensitivity): <2 ng/L (ref <18)
Troponin I (High Sensitivity): <2 ng/L (ref <18)

## 2020-07-11 LAB — LIPASE, BLOOD: Lipase: 25 U/L (ref 11–51)

## 2020-07-11 MED ORDER — PANTOPRAZOLE SODIUM 20 MG PO TBEC: 20.0000 mg | DELAYED_RELEASE_TABLET | Freq: Every day | ORAL | 0 refills | Status: DC

## 2020-07-11 NOTE — ED Triage Notes (Signed)
Pt c/o chest pain and back pain that started about 5 hours prior to arrival.

## 2020-07-11 NOTE — ED Provider Notes (Signed)
[facility] EMERGENCY DEPARTMENT Provider Note   CSN: [id number] Arrival date & time: 07/11/20  0141   History Chief Complaint  Patient presents with  . Chest Pain    Jessica Barry is a 29 y.o. female.  The history is provided by the patient.  Chest Pain She has no significant history and comes in because of an episode of chest pain which started about 11:30 PM.  Pain was dull and in the mid and lower sternal area with radiation to the back.  Also seem to radiate down to her periumbilical area.  Pain was rated at 9/10 at its worst, has subsided to 4/10.  Was associated dyspnea and nausea but no diaphoresis.  Pain started after eating some pizza.  Apparently, she always has some kind of distress after eating pizza but this was more severe.  She is a non-smoker and there is no history of diabetes or hypertension or hyperlipidemia.  There is a positive family history of premature coronary atherosclerosis.  Past Medical History:  Diagnosis Date  . Epilepsy (HCC)    as a child; "I grew out of it"  . Lumbago   . Migraine   . Neuralgia   . Radicular pain     Patient Active Problem List   Diagnosis Date Noted  . Edema, peripheral 01/17/2020  . S/P knee surgery 09/25/2013  . Meniscus, lateral, derangement 09/07/2013  . Sprain of cruciate ligament of knee 09/07/2013  . Lateral meniscus tear 09/07/2013  . Lateral meniscal tear 09/07/2013  . Left knee sprain 08/08/2013    Past Surgical History:  Procedure Laterality Date  . KNEE ARTHROSCOPY WITH LATERAL MENISECTOMY Left 09/22/2013   Procedure: LEFT KNEE ARTHROSCOPY WITH LATERAL MENISECTOMY;  Surgeon: Vickki Hearing, MD;  Location: [facility];  Service: Orthopedics;  Laterality: Left;  . MENISCUS REPAIR Left   . WISDOM TOOTH EXTRACTION       OB History   No obstetric history on file.     Family History  Problem Relation Age of Onset  . CAD Mother        triple bypass  . Epilepsy Other     Social History   Tobacco Use   . Smoking status: Never Smoker  . Smokeless tobacco: Never Used  Vaping Use  . Vaping Use: Never used  Substance Use Topics  . Alcohol use: Yes    Comment: occasionally  . Drug use: No    Home Medications Prior to Admission medications   Not on File    Allergies    Patient has no known allergies.  Review of Systems   Review of Systems  Cardiovascular: Positive for chest pain.  All other systems reviewed and are negative.   Physical Exam Updated Vital Signs BP 133/76 (BP Location: Left Arm)   Pulse 75   Temp 98.6 F (37 C) (Oral)   Resp 17   Ht 5\' 5"  (1.651 m)   Wt (!) 145.2 kg   SpO2 95%   BMI 53.25 kg/m   Physical Exam Vitals and nursing note reviewed.   Morbidly obese 29 year old female, resting comfortably and in no acute distress. Vital signs are normal. Oxygen saturation is 95%, which is normal. Head is normocephalic and atraumatic. PERRLA, EOMI. Oropharynx is clear. Neck is nontender and supple without adenopathy or JVD. Back is nontender and there is no CVA tenderness. Lungs are clear without rales, wheezes, or rhonchi. Chest is mildly tender over the mid and lower sternal area.  There is no crepitus. Heart has regular rate and rhythm without murmur. Abdomen is soft, flat, with moderate epigastric and right upper quadrant tenderness.  There is a plus/minus Murphy sign present.  There is no rebound or guarding.  There are no masses or hepatosplenomegaly and peristalsis is hypoactive. Extremities have 1+ edema, full range of motion is present. Skin is warm and dry without rash. Neurologic: Mental status is normal, cranial nerves are intact, there are no motor or sensory deficits.  ED Results / Procedures / Treatments   Labs (all labs ordered are listed, but only abnormal results are displayed) Labs Reviewed  BASIC METABOLIC PANEL - Abnormal; Notable for the following components:      Result Value   Glucose, Bld 116 (*)    Calcium 8.5 (*)    All other  components within normal limits  CBC - Abnormal; Notable for the following components:   WBC 11.2 (*)    All other components within normal limits  HEPATIC FUNCTION PANEL - Abnormal; Notable for the following components:   AST 139 (*)    ALT 76 (*)    All other components within normal limits  LIPASE, BLOOD  POC URINE PREG, ED  TROPONIN I (HIGH SENSITIVITY)  TROPONIN I (HIGH SENSITIVITY)    EKG EKG Interpretation  Date/Time:  Thursday July 11 2020 01:58:43 EDT Ventricular Rate:  82 PR Interval:  144 QRS Duration: 78 QT Interval:  330 QTC Calculation: 385 R Axis:   87 Text Interpretation: Normal sinus rhythm Normal ECG No old tracing to compare Confirmed by Dione Booze ([id number]) on 07/11/2020 2:55:36 AM   Radiology DG Chest 2 View  Result Date: 07/11/2020 CLINICAL DATA:  29 year old female with chest and flank pain. Pending test for COVID-19. EXAM: CHEST - 2 VIEW COMPARISON:  Chest radiographs 06/07/2018. FINDINGS: Lower lung volumes. Mediastinal contours remain normal. Visualized tracheal air column is within normal limits. No pneumothorax or pleural effusion. Crowding of lung markings. No consolidation or convincing airspace opacity. No acute osseous abnormality identified. Negative visible bowel gas pattern. IMPRESSION: Lower lung volumes.  No definite cardiopulmonary abnormality. Electronically Signed   By: Odessa Fleming M.D.   On: 07/11/2020 02:40    Procedures Procedures   Medications Ordered in ED Medications - No data to display  ED Course  I have reviewed the triage vital signs and the nursing notes.  Pertinent labs & imaging results that were available during my care of the patient were reviewed by me and considered in my medical decision making (see chart for details).  MDM Rules/Calculators/A&P Chest pain which seems unlikely to be cardiac.  Patient's only risk factors are obesity and positive family history.  Pain is somewhat atypical.  Relationship to fatty food  intake is suggestive of biliary tract disease.  ECG is normal.  Chest x-ray is unremarkable.  Initial troponin is normal.  Will check hepatic function studies, lipase and get right upper quadrant ultrasound.  Old records are reviewed, and she has no relevant past visits.  Repeat troponin is normal.  Hepatic function studies are significant for mild elevation of transaminases.  This is new compared with June 2019..  Right upper quadrant ultrasound is pending.  Case is signed out to Dr. Charm Barges.  Final Clinical Impression(s) / ED Diagnoses Final diagnoses:  Nonspecific chest pain  RUQ abdominal pain  Elevated transaminase level    Rx / DC Orders ED Discharge Orders    None       Dione Booze, MD  07/11/20 0711  

## 2020-07-11 NOTE — ED Provider Notes (Signed)
Signout from Dr. Preston Fleeting.  29 year old female here with chest and back pain after eating.  Plan is to follow-up on right upper quadrant ultrasound. Physical Exam  BP 135/77   Pulse 82   Temp 98.6 F (37 C) (Oral)   Resp 18   Ht 5\' 5"  (1.651 m)   Wt (!) 145.2 kg   SpO2 97%   BMI 53.25 kg/m   Physical Exam  ED Course/Procedures     Procedures  MDM  Ultrasound showing fatty liver, no gallstones or gallbladder thickening. Reviewed with patient. She has PCP to follow-up with. We will start her on a PPI. Return instructions discussed.      , MD 07/11/20 1754

## 2020-07-11 NOTE — Discharge Instructions (Addendum)
You were seen in the emergency department for chest and abdominal pain.  Your work-up did not show any evidence of any cardiac injury.  Your liver numbers were slightly elevated.  You had a right upper quadrant ultrasound that did not show any gallstones.  They did see some slight abnormalities on the liver that will need follow-up with your doctor.  We are prescribing you some acid medication to help with your symptoms.  Included below is the report of your ultrasound.  Please follow-up with your doctor and return to the emergency department if any worsening or concerning symptoms.  IMPRESSION:  The echogenicity of the liver is mildly increased. This is a  nonspecific finding but is most commonly seen with fatty  infiltration of the liver. There are no obvious focal liver lesions.

## 2020-07-15 ENCOUNTER — Ambulatory Visit: Payer: BC Managed Care – PPO

## 2020-07-15 ENCOUNTER — Encounter (HOSPITAL_COMMUNITY): Payer: BC Managed Care – PPO

## 2020-07-17 DIAGNOSIS — M79671 Pain in right foot: Secondary | ICD-10-CM | POA: Diagnosis not present

## 2020-07-17 DIAGNOSIS — B353 Tinea pedis: Secondary | ICD-10-CM | POA: Diagnosis not present

## 2020-07-17 DIAGNOSIS — M79672 Pain in left foot: Secondary | ICD-10-CM | POA: Diagnosis not present

## 2020-09-24 IMAGING — DX DG CHEST 2V
2 series · 2 of 2 positions shown · non-contrast
Comparison: Chest radiographs 06/07/2018.

CLINICAL DATA: 29-year-old female with chest and flank pain.
Pending test for [id number].

EXAM:
CHEST - 2 VIEW

[chest pa]
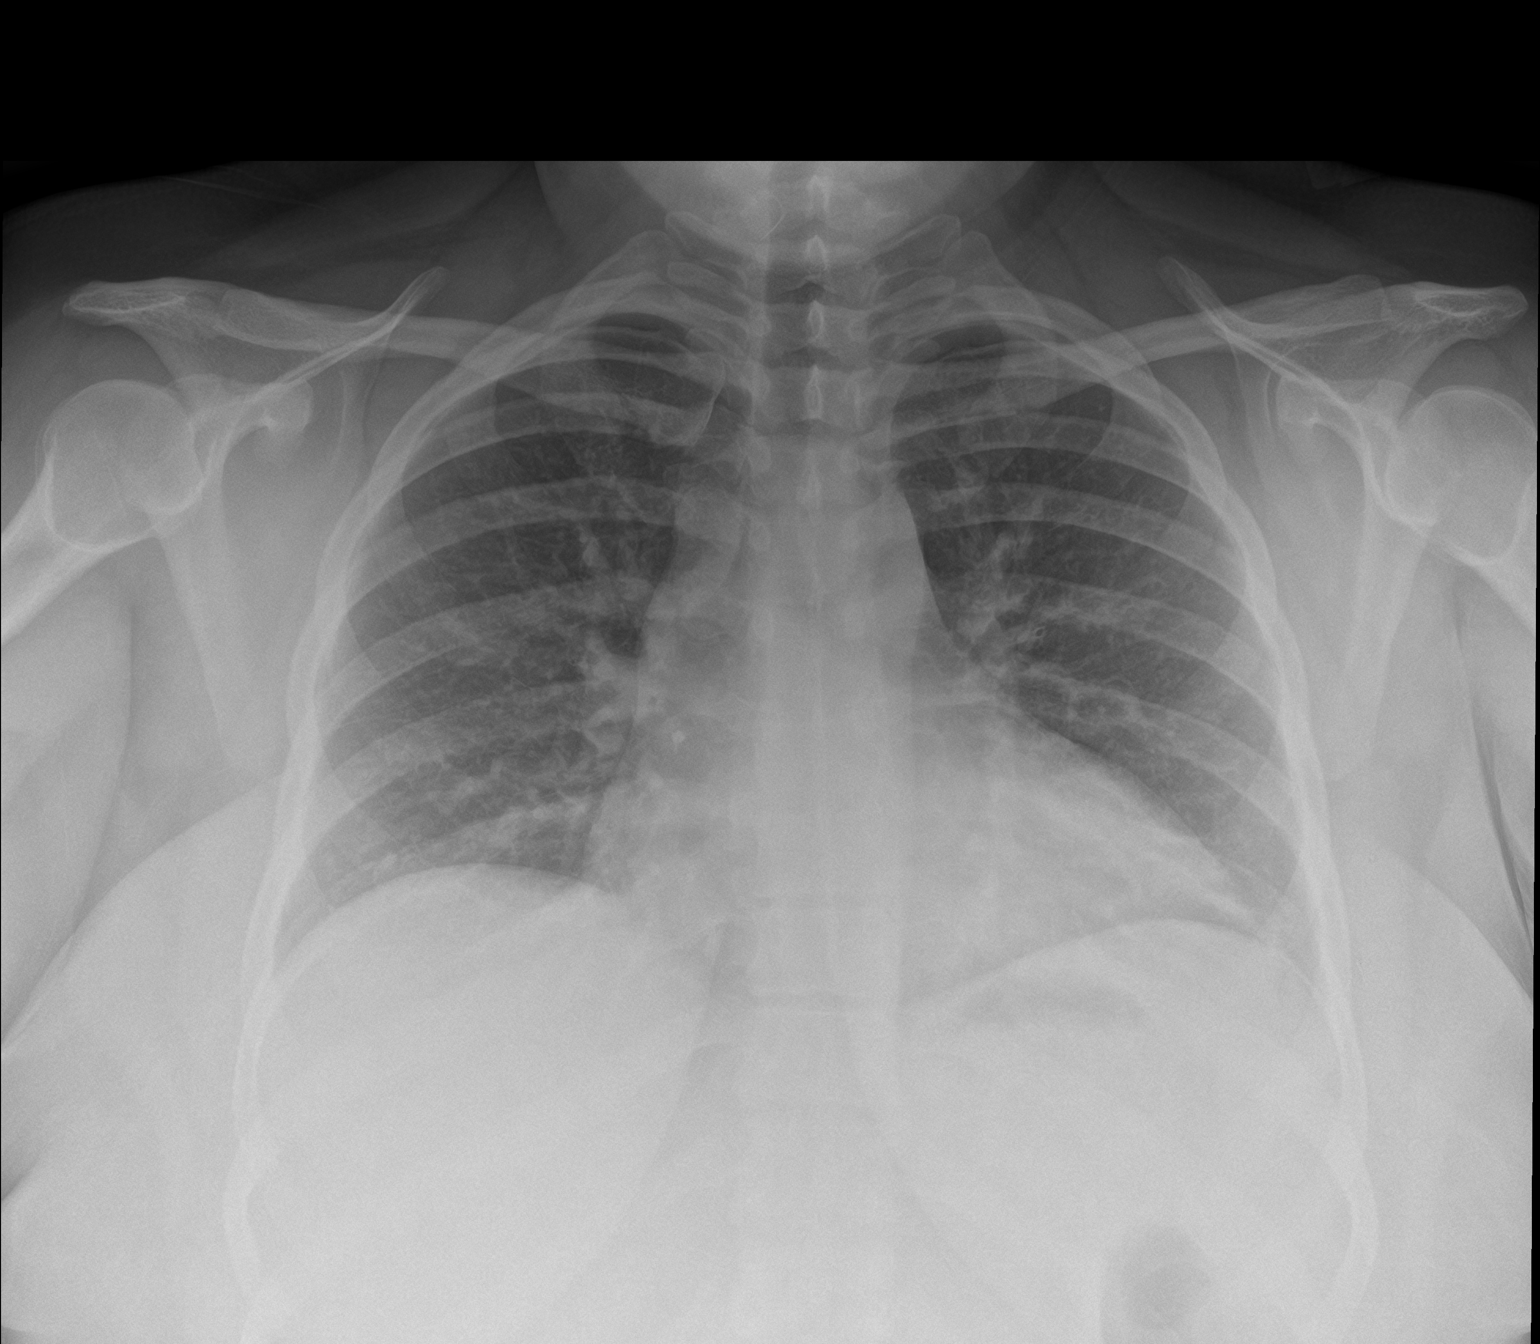

[chest lat]
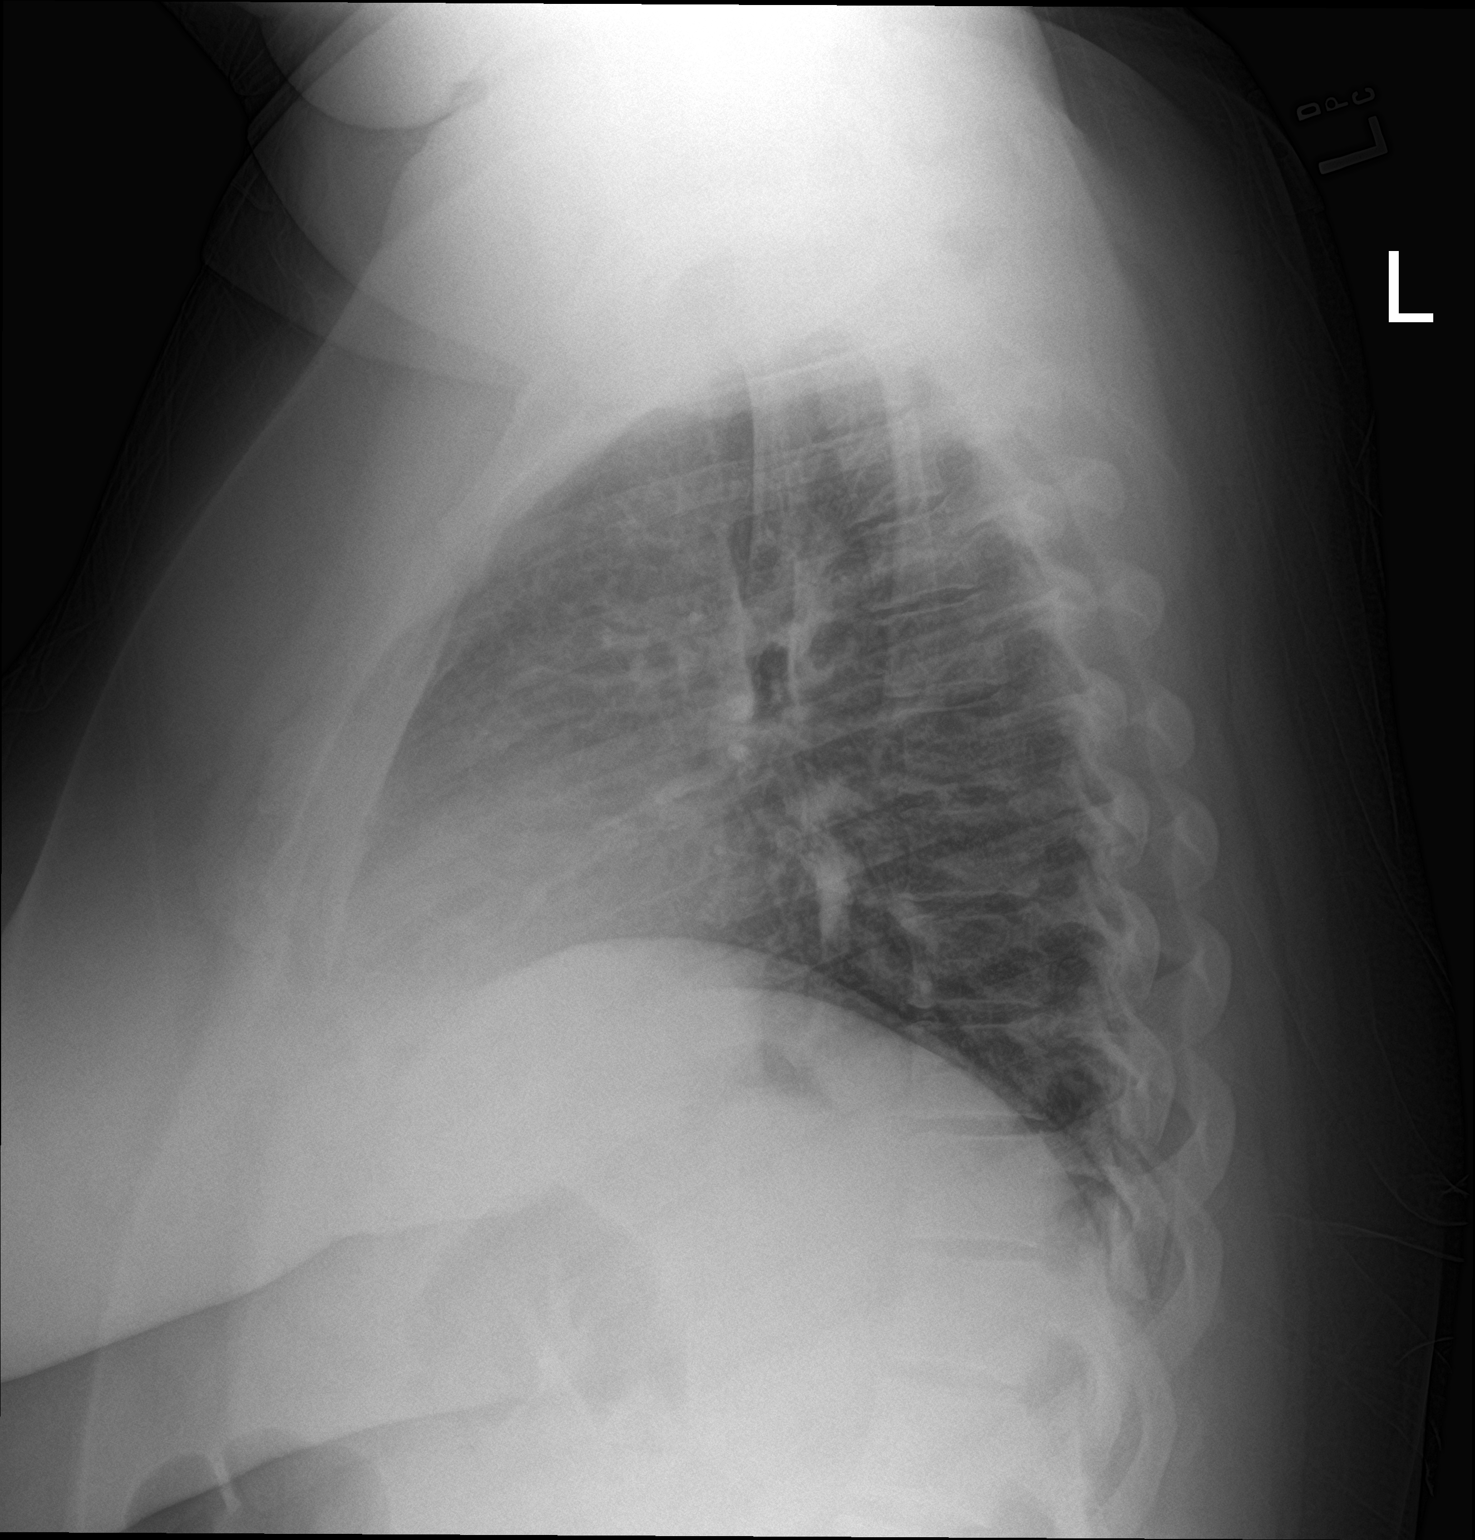

[2 of 2 positions shown; findings below may reference images not displayed]

FINDINGS: Lower lung volumes. Mediastinal contours remain normal. Visualized
tracheal air column is within normal limits. No pneumothorax or
pleural effusion. Crowding of lung markings. No consolidation or
convincing airspace opacity.

No acute osseous abnormality identified. Negative visible bowel gas
pattern.
IMPRESSION: Lower lung volumes.  No definite cardiopulmonary abnormality.

## 2020-09-24 IMAGING — US US ABDOMEN LIMITED
1 series · 14 of 25 positions shown · non-contrast
Comparison: 10/15/2017

CLINICAL DATA: Right upper quadrant pain for 1 day

EXAM:
ULTRASOUND ABDOMEN LIMITED RIGHT UPPER QUADRANT

[Series 1: us abdomen limited · 14 of 54 slices shown]
[im 1/54]
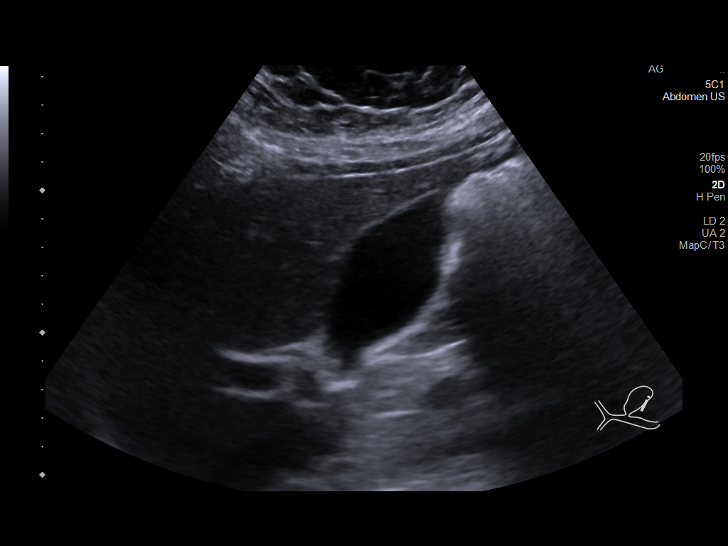
[im 5/54]
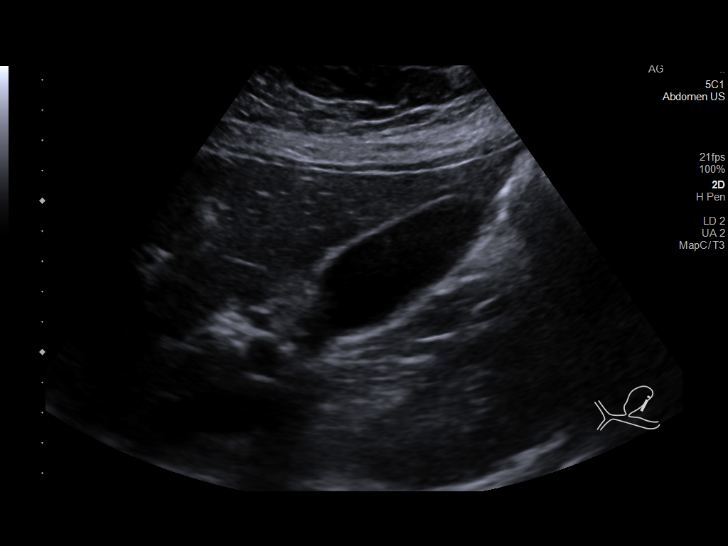
[im 9/54]
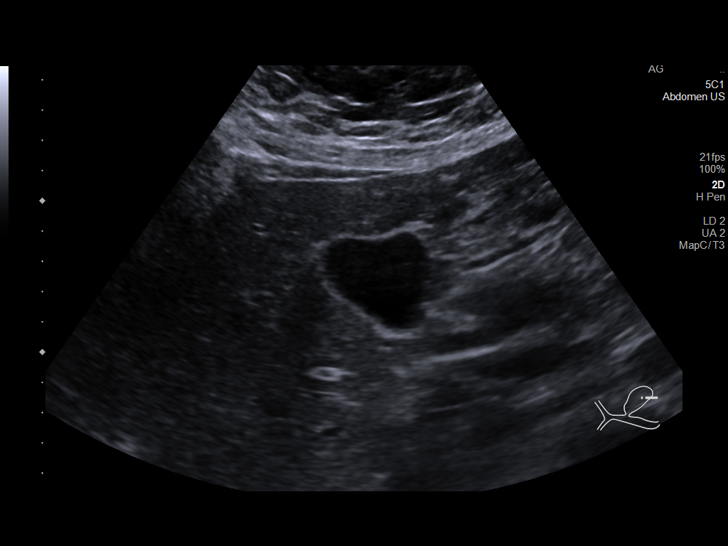
[im 14/54]
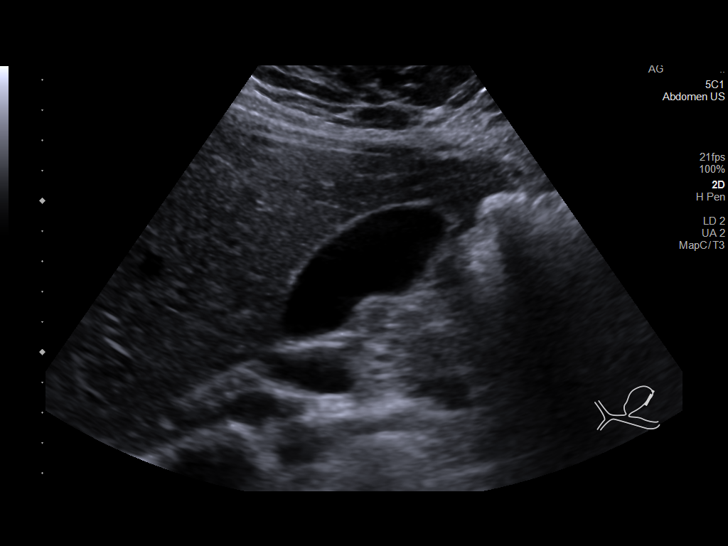
[im 18/54]
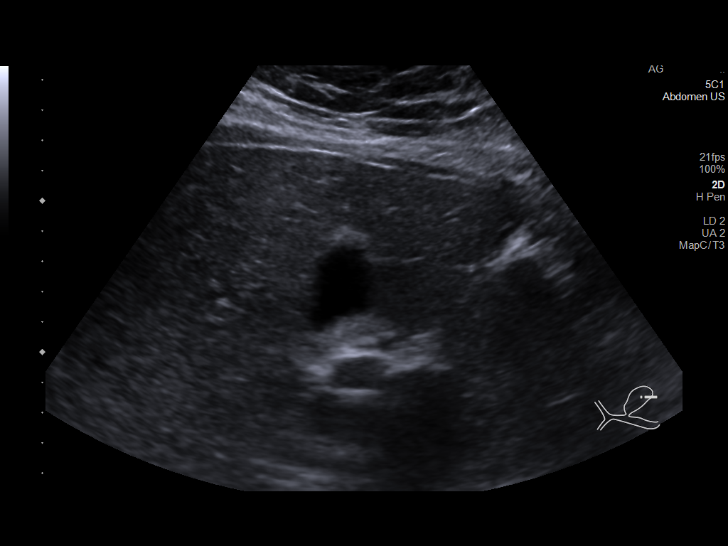
[im 20/54]
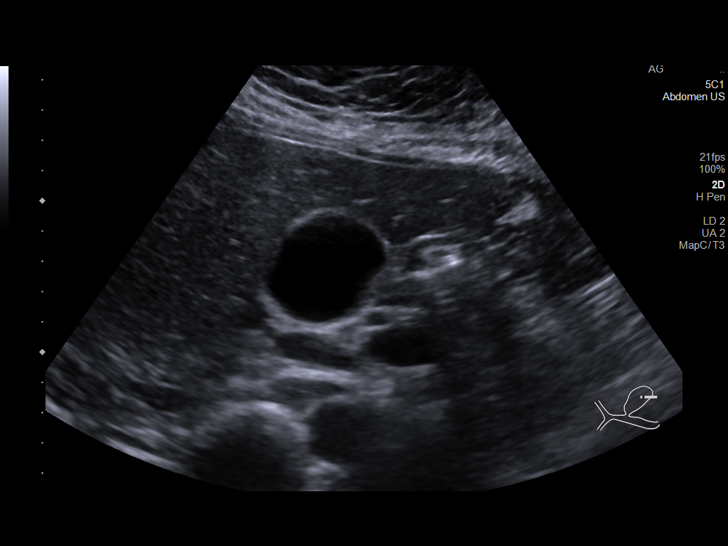
[im 25/54]
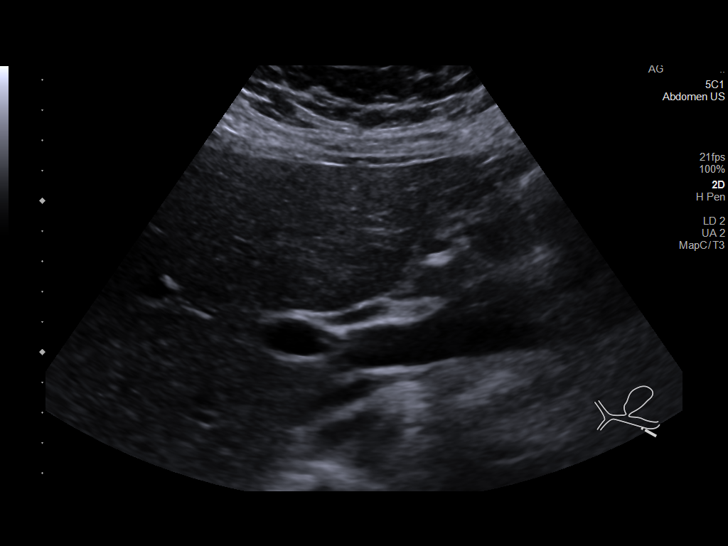
[im 29/54]
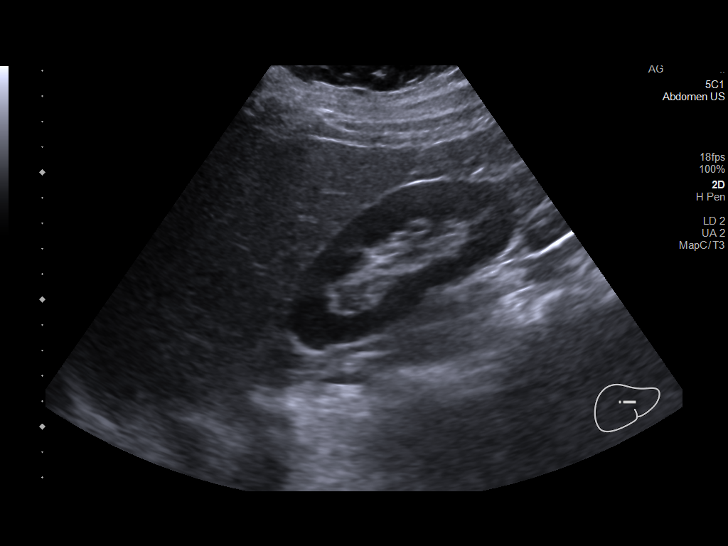
[im 34/54]
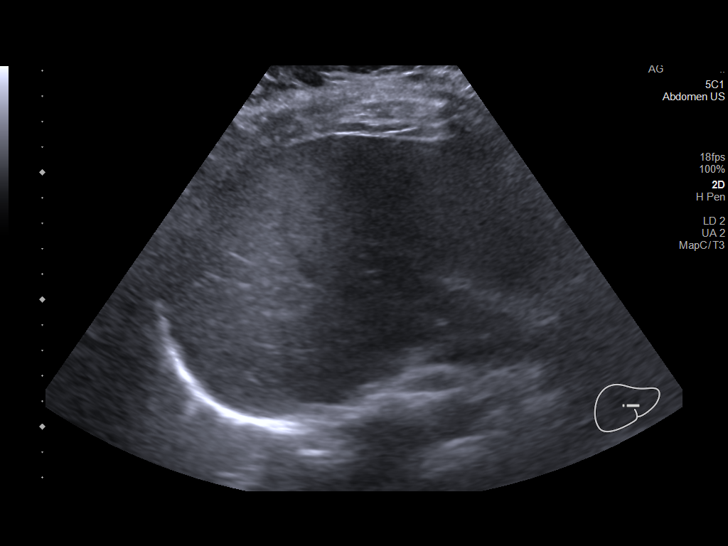
[im 36/54]
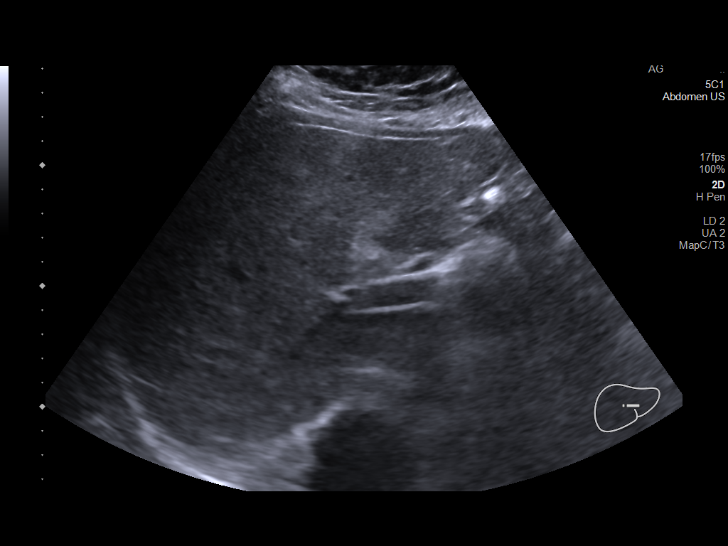
[im 40/54]
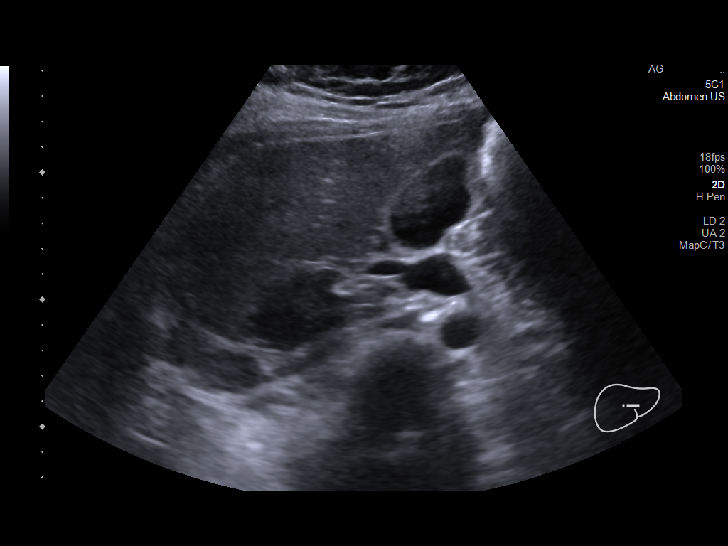
[im 45/54]
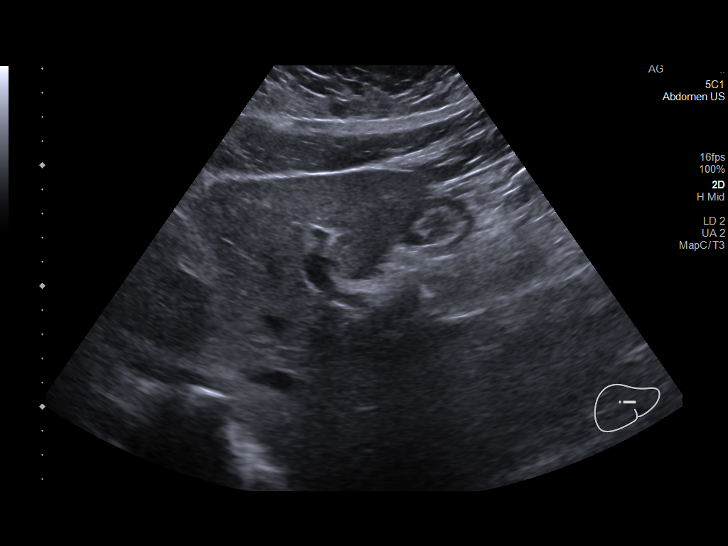
[im 49/54]
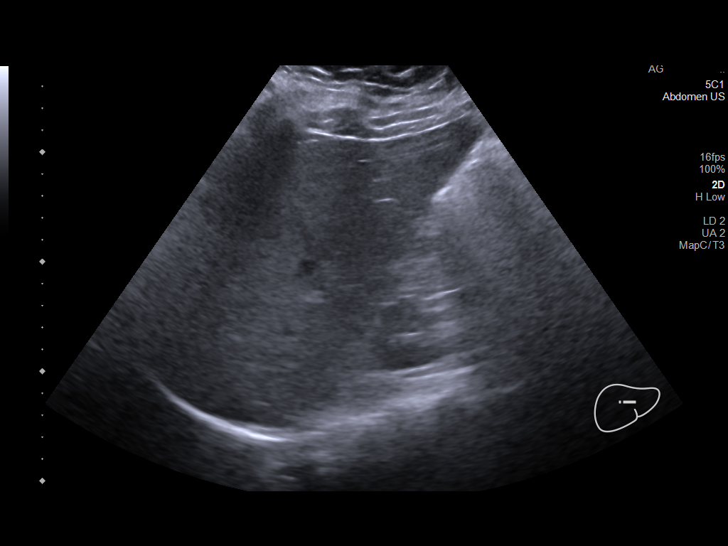
[im 54/54]
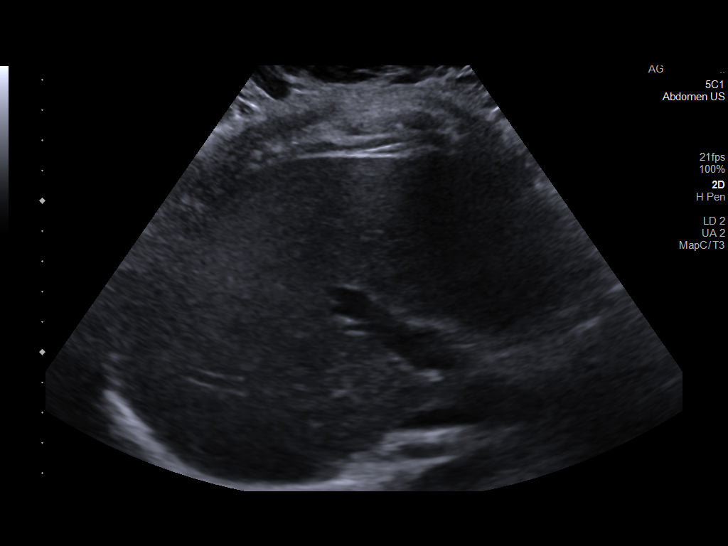

[14 of 25 positions shown; findings below may reference images not displayed]

FINDINGS: Gallbladder:

No gallstones or wall thickening visualized. No sonographic Murphy
sign noted by sonographer.

Common bile duct:

Diameter: 4 mm.

Liver:

No focal lesion identified. Mildly increased hepatic parenchymal
echogenicity. Portal vein is patent on color Doppler imaging with
normal direction of blood flow towards the liver.

Other: None.
IMPRESSION: The echogenicity of the liver is mildly increased. This is a
nonspecific finding but is most commonly seen with fatty
infiltration of the liver. There are no obvious focal liver lesions.

## 2021-05-19 DIAGNOSIS — Z6841 Body Mass Index (BMI) 40.0 and over, adult: Secondary | ICD-10-CM | POA: Diagnosis not present

## 2021-05-19 DIAGNOSIS — N898 Other specified noninflammatory disorders of vagina: Secondary | ICD-10-CM | POA: Diagnosis not present

## 2021-05-19 DIAGNOSIS — Z01419 Encounter for gynecological examination (general) (routine) without abnormal findings: Secondary | ICD-10-CM | POA: Diagnosis not present

## 2022-08-10 DIAGNOSIS — O99210 Obesity complicating pregnancy, unspecified trimester: Secondary | ICD-10-CM | POA: Diagnosis not present

## 2022-08-10 DIAGNOSIS — Z6841 Body Mass Index (BMI) 40.0 and over, adult: Secondary | ICD-10-CM | POA: Diagnosis not present

## 2022-08-10 DIAGNOSIS — Z3481 Encounter for supervision of other normal pregnancy, first trimester: Secondary | ICD-10-CM | POA: Diagnosis not present

## 2022-08-10 DIAGNOSIS — Z3490 Encounter for supervision of normal pregnancy, unspecified, unspecified trimester: Secondary | ICD-10-CM | POA: Diagnosis not present

## 2022-08-10 DIAGNOSIS — N912 Amenorrhea, unspecified: Secondary | ICD-10-CM | POA: Diagnosis not present

## 2022-08-10 DIAGNOSIS — Z3201 Encounter for pregnancy test, result positive: Secondary | ICD-10-CM | POA: Diagnosis not present

## 2022-08-10 DIAGNOSIS — Z3689 Encounter for other specified antenatal screening: Secondary | ICD-10-CM | POA: Diagnosis not present

## 2022-08-29 ENCOUNTER — Encounter (HOSPITAL_COMMUNITY): Payer: Self-pay | Admitting: Emergency Medicine

## 2022-08-29 ENCOUNTER — Other Ambulatory Visit: Payer: Self-pay

## 2022-08-29 ENCOUNTER — Emergency Department (HOSPITAL_COMMUNITY)
Admission: EM | Admit: 2022-08-29 | Discharge: 2022-08-29 | Disposition: A | Discharge status: Home / Self Care | Payer: Medicaid Other | Attending: Emergency Medicine | Admitting: Emergency Medicine

## 2022-08-29 DIAGNOSIS — M25511 Pain in right shoulder: Secondary | ICD-10-CM | POA: Diagnosis not present

## 2022-08-29 DIAGNOSIS — O99891 Other specified diseases and conditions complicating pregnancy: Secondary | ICD-10-CM | POA: Diagnosis not present

## 2022-08-29 DIAGNOSIS — Z3A13 13 weeks gestation of pregnancy: Secondary | ICD-10-CM | POA: Diagnosis not present

## 2022-08-29 DIAGNOSIS — O26891 Other specified pregnancy related conditions, first trimester: Secondary | ICD-10-CM | POA: Diagnosis present

## 2022-08-29 MED ORDER — CYCLOBENZAPRINE HCL 10 MG PO TABS: 10.0000 mg | ORAL_TABLET | Freq: Two times a day (BID) | ORAL | 0 refills | Status: DC | PRN

## 2022-08-29 MED ORDER — LIDOCAINE 5 % EX PTCH: 1.0000 | MEDICATED_PATCH | CUTANEOUS | 0 refills | Status: DC

## 2022-08-29 MED ORDER — CYCLOBENZAPRINE HCL 10 MG PO TABS
10.0000 mg | ORAL_TABLET | Freq: Once | ORAL | Status: AC
  Administered 2022-08-29: 10 mg via ORAL
  Filled 2022-08-29: qty 1

## 2022-08-29 MED ORDER — ACETAMINOPHEN 325 MG PO TABS
650.0000 mg | ORAL_TABLET | Freq: Once | ORAL | Status: AC
  Administered 2022-08-29: 650 mg via ORAL
  Filled 2022-08-29: qty 2

## 2022-08-29 NOTE — ED Triage Notes (Signed)
Pt with c/o "severe" right shoulder pain that radiates down right arm x 1 week. Denies injury.

## 2022-08-29 NOTE — ED Provider Notes (Signed)
[facility] EMERGENCY DEPARTMENT  Provider Note  CSN: [id number] Arrival date & time: 08/29/22 0532  History Chief Complaint  Patient presents with   Shoulder Pain    Jessica Barry is a 31 y.o. female approximately [redacted] weeks pregnant presents for evaluation of 1 week of R shoulder pain, worse with movement. Not associated with any falls or injuries. No abdominal pain or vomiting. She has tried numerous OTC remedies without improvement. Has not discussed with her Ob/Gyn.    Home Medications Prior to Admission medications   Medication Sig Start Date End Date Taking? Authorizing Provider  cyclobenzaprine (FLEXERIL) 10 MG tablet Take 1 tablet (10 mg total) by mouth 2 (two) times daily as needed for muscle spasms. 08/29/22  Yes Pollyann Savoy, MD  lidocaine (LIDODERM) 5 % Place 1 patch onto the skin daily. Remove & Discard patch within 12 hours or as directed by MD 08/29/22  Yes Pollyann Savoy, MD  pantoprazole (PROTONIX) 20 MG tablet Take 1 tablet (20 mg total) by mouth daily. 07/11/20   Terrilee Files, MD     Allergies    Patient has no known allergies.   Review of Systems   Review of Systems Please see HPI for pertinent positives and negatives  Physical Exam BP (!) 135/90 (BP Location: Right Wrist)   Pulse 60   Temp 97.8 F (36.6 C) (Oral)   Resp 18   Ht 5\' 5"  (1.651 m)   Wt (!) 154.2 kg   LMP 06/29/2022   SpO2 98%   BMI 56.58 kg/m   Physical Exam Vitals and nursing note reviewed.  HENT:     Head: Normocephalic.     Nose: Nose normal.  Eyes:     Extraocular Movements: Extraocular movements intact.  Pulmonary:     Effort: Pulmonary effort is normal.  Musculoskeletal:        General: Tenderness (R trapezius muscle) present. Normal range of motion.     Cervical back: Neck supple.  Skin:    Findings: No rash (on exposed skin).  Neurological:     Mental Status: She is alert and oriented to person, place, and time.  Psychiatric:        Mood and Affect:  Mood normal.     ED Results / Procedures / Treatments   EKG None  Procedures Procedures  Medications Ordered in the ED Medications  cyclobenzaprine (FLEXERIL) tablet 10 mg (10 mg Oral Given 08/29/22 0602)  acetaminophen (TYLENOL) tablet 650 mg (650 mg Oral Given 08/29/22 0602)    Initial Impression and Plan  Patient with R shoulder pain, likely muscle spasm given exam. Pregnancy limits medication options. Will recommend APAP, Rx for Flexeril and lidoderm patches. Recommend follow up with her Ob/Gyn if not improving.   ED Course       MDM Rules/Calculators/A&P Medical Decision Making Problems Addressed: Acute pain of right shoulder: acute illness or injury  Risk OTC drugs. Prescription drug management.    Final Clinical Impression(s) / ED Diagnoses Final diagnoses:  Acute pain of right shoulder    Rx / DC Orders ED Discharge Orders          Ordered    cyclobenzaprine (FLEXERIL) 10 MG tablet  2 times daily PRN        08/29/22 0602    lidocaine (LIDODERM) 5 %  Every 24 hours        08/29/22 0602             10/29/22  B, MD 08/29/22 9983

## 2022-08-29 NOTE — ED Notes (Signed)
ED Provider at bedside. 

## 2022-10-19 DIAGNOSIS — Z363 Encounter for antenatal screening for malformations: Secondary | ICD-10-CM | POA: Diagnosis not present

## 2022-12-17 DIAGNOSIS — Z23 Encounter for immunization: Secondary | ICD-10-CM | POA: Diagnosis not present

## 2022-12-17 DIAGNOSIS — Z3689 Encounter for other specified antenatal screening: Secondary | ICD-10-CM | POA: Diagnosis not present

## 2022-12-17 DIAGNOSIS — Z362 Encounter for other antenatal screening follow-up: Secondary | ICD-10-CM | POA: Diagnosis not present

## 2022-12-25 DIAGNOSIS — R7309 Other abnormal glucose: Secondary | ICD-10-CM | POA: Diagnosis not present

## 2023-01-06 DIAGNOSIS — O09893 Supervision of other high risk pregnancies, third trimester: Secondary | ICD-10-CM | POA: Diagnosis not present

## 2023-01-06 DIAGNOSIS — Z3A31 31 weeks gestation of pregnancy: Secondary | ICD-10-CM | POA: Diagnosis not present

## 2023-01-06 DIAGNOSIS — O99213 Obesity complicating pregnancy, third trimester: Secondary | ICD-10-CM | POA: Diagnosis not present

## 2023-01-06 DIAGNOSIS — Z362 Encounter for other antenatal screening follow-up: Secondary | ICD-10-CM | POA: Diagnosis not present

## 2023-01-06 DIAGNOSIS — Z363 Encounter for antenatal screening for malformations: Secondary | ICD-10-CM | POA: Diagnosis not present

## 2023-01-25 DIAGNOSIS — Z3A30 30 weeks gestation of pregnancy: Secondary | ICD-10-CM | POA: Diagnosis not present

## 2023-01-25 DIAGNOSIS — R002 Palpitations: Secondary | ICD-10-CM | POA: Diagnosis not present

## 2023-02-03 DIAGNOSIS — O99213 Obesity complicating pregnancy, third trimester: Secondary | ICD-10-CM | POA: Diagnosis not present

## 2023-02-10 DIAGNOSIS — Z3685 Encounter for antenatal screening for Streptococcus B: Secondary | ICD-10-CM | POA: Diagnosis not present

## 2023-02-10 DIAGNOSIS — Z3689 Encounter for other specified antenatal screening: Secondary | ICD-10-CM | POA: Diagnosis not present

## 2023-02-24 DIAGNOSIS — O3663X0 Maternal care for excessive fetal growth, third trimester, not applicable or unspecified: Secondary | ICD-10-CM | POA: Diagnosis not present

## 2023-02-24 DIAGNOSIS — O99213 Obesity complicating pregnancy, third trimester: Secondary | ICD-10-CM | POA: Diagnosis not present

## 2023-02-28 DIAGNOSIS — O99214 Obesity complicating childbirth: Secondary | ICD-10-CM | POA: Diagnosis not present

## 2023-02-28 DIAGNOSIS — Z3A39 39 weeks gestation of pregnancy: Secondary | ICD-10-CM | POA: Diagnosis not present

## 2023-03-01 DIAGNOSIS — Z3A00 Weeks of gestation of pregnancy not specified: Secondary | ICD-10-CM | POA: Diagnosis not present

## 2023-03-01 DIAGNOSIS — O99214 Obesity complicating childbirth: Secondary | ICD-10-CM | POA: Diagnosis not present

## 2023-03-01 DIAGNOSIS — Z3A39 39 weeks gestation of pregnancy: Secondary | ICD-10-CM | POA: Diagnosis not present

## 2023-03-01 DIAGNOSIS — Z6841 Body Mass Index (BMI) 40.0 and over, adult: Secondary | ICD-10-CM | POA: Diagnosis not present

## 2023-05-26 DIAGNOSIS — O99212 Obesity complicating pregnancy, second trimester: Secondary | ICD-10-CM | POA: Diagnosis not present

## 2023-05-26 DIAGNOSIS — Z6841 Body Mass Index (BMI) 40.0 and over, adult: Secondary | ICD-10-CM | POA: Diagnosis not present

## 2023-09-09 ENCOUNTER — Emergency Department (HOSPITAL_COMMUNITY)
Admission: EM | Admit: 2023-09-09 | Discharge: 2023-09-09 | Disposition: A | Discharge status: Home / Self Care | Payer: Medicaid Other | Attending: Emergency Medicine | Admitting: Emergency Medicine

## 2023-09-09 ENCOUNTER — Emergency Department (HOSPITAL_COMMUNITY): Payer: Medicaid Other

## 2023-09-09 ENCOUNTER — Encounter (HOSPITAL_COMMUNITY): Payer: Self-pay | Admitting: *Deleted

## 2023-09-09 ENCOUNTER — Other Ambulatory Visit: Payer: Self-pay

## 2023-09-09 DIAGNOSIS — S0990XA Unspecified injury of head, initial encounter: Secondary | ICD-10-CM | POA: Insufficient documentation

## 2023-09-09 DIAGNOSIS — S161XXA Strain of muscle, fascia and tendon at neck level, initial encounter: Secondary | ICD-10-CM | POA: Insufficient documentation

## 2023-09-09 DIAGNOSIS — Z23 Encounter for immunization: Secondary | ICD-10-CM | POA: Diagnosis not present

## 2023-09-09 DIAGNOSIS — S60222A Contusion of left hand, initial encounter: Secondary | ICD-10-CM | POA: Insufficient documentation

## 2023-09-09 DIAGNOSIS — S7012XA Contusion of left thigh, initial encounter: Secondary | ICD-10-CM | POA: Diagnosis not present

## 2023-09-09 DIAGNOSIS — S60511A Abrasion of right hand, initial encounter: Secondary | ICD-10-CM | POA: Diagnosis not present

## 2023-09-09 DIAGNOSIS — M25552 Pain in left hip: Secondary | ICD-10-CM | POA: Diagnosis not present

## 2023-09-09 DIAGNOSIS — M542 Cervicalgia: Secondary | ICD-10-CM | POA: Diagnosis not present

## 2023-09-09 DIAGNOSIS — S199XXA Unspecified injury of neck, initial encounter: Secondary | ICD-10-CM | POA: Diagnosis not present

## 2023-09-09 LAB — HCG, SERUM, QUALITATIVE: Preg, Serum: NEGATIVE

## 2023-09-09 MED ORDER — TETANUS-DIPHTH-ACELL PERTUSSIS 5-2.5-18.5 LF-MCG/0.5 IM SUSY
0.5000 mL | PREFILLED_SYRINGE | Freq: Once | INTRAMUSCULAR | Status: AC
  Administered 2023-09-09: 0.5 mL via INTRAMUSCULAR
  Filled 2023-09-09: qty 0.5

## 2023-09-09 NOTE — ED Notes (Signed)
Scrapes cleaned.  Did not necessitate a dressing.
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
## 2023-09-09 NOTE — ED Notes (Signed)
Back from imaging, pending results. C-collar remains. Pain decreased.

## 2023-09-09 NOTE — ED Notes (Signed)
EDP at [facility] 

## 2024-03-08 ENCOUNTER — Ambulatory Visit
Admission: EM | Admit: 2024-03-08 | Discharge: 2024-03-08 | Disposition: A | Discharge status: Home / Self Care | Attending: Family Medicine | Admitting: Family Medicine

## 2024-03-08 ENCOUNTER — Encounter: Payer: Self-pay | Admitting: Emergency Medicine

## 2024-03-08 DIAGNOSIS — R110 Nausea: Secondary | ICD-10-CM

## 2024-03-08 DIAGNOSIS — R197 Diarrhea, unspecified: Secondary | ICD-10-CM

## 2024-03-08 LAB — POC COVID19/FLU A&B COMBO
Covid Antigen, POC: NEGATIVE
Influenza A Antigen, POC: NEGATIVE
Influenza B Antigen, POC: NEGATIVE

## 2024-03-08 MED ORDER — LOPERAMIDE HCL 2 MG PO CAPS: 2.0000 mg | ORAL_CAPSULE | Freq: Four times a day (QID) | ORAL | 0 refills | Status: AC | PRN

## 2024-03-08 MED ORDER — ONDANSETRON 4 MG PO TBDP: 4.0000 mg | ORAL_TABLET | Freq: Three times a day (TID) | ORAL | 0 refills | Status: AC | PRN

## 2024-03-08 MED ORDER — ONDANSETRON 4 MG PO TBDP
4.0000 mg | ORAL_TABLET | Freq: Once | ORAL | Status: AC
  Administered 2024-03-08: 4 mg via ORAL

## 2024-03-08 NOTE — ED Triage Notes (Signed)
 Diarrhea, weakness, body aches, headaches, dizziness since yesterday

## 2024-03-12 NOTE — ED Provider Notes (Signed)
 [facility]    CSN: [id number] Arrival date & time: 03/08/24  0904      History   Chief Complaint No chief complaint on file.   HPI Jessica Barry is a 33 y.o. female.   Presenting today with 1 day history of diarrhea, body aches, headaches, weakness, dizziness x 1 day. Denies CP, SOB, abdominal pain, syncope, congestion. So far trying OTC remedies with minimal relief.    Past Medical History:  Diagnosis Date   Epilepsy [facility])    as a child; "I grew out of it"   Lumbago    Migraine    Neuralgia    Radicular pain     Patient Active Problem List   Diagnosis Date Noted   Edema, peripheral 01/17/2020   S/P knee surgery 09/25/2013   Meniscus, lateral, derangement 09/07/2013   Sprain of cruciate ligament of knee 09/07/2013   Lateral meniscus tear 09/07/2013   Lateral meniscal tear 09/07/2013   Left knee sprain 08/08/2013    Past Surgical History:  Procedure Laterality Date   KNEE ARTHROSCOPY WITH LATERAL MENISECTOMY Left 09/22/2013   Procedure: LEFT KNEE ARTHROSCOPY WITH LATERAL MENISECTOMY;  Surgeon: Vickki Hearing, MD;  Location: [facility];  Service: Orthopedics;  Laterality: Left;   MENISCUS REPAIR Left    WISDOM TOOTH EXTRACTION      OB History     Gravida  1   Para      Term      Preterm      AB      Living         SAB      IAB      Ectopic      Multiple      Live Births               Home Medications    Prior to Admission medications   Medication Sig Start Date End Date Taking? Authorizing Provider  loperamide (IMODIUM) 2 MG capsule Take 1 capsule (2 mg total) by mouth 4 (four) times daily as needed for diarrhea or loose stools. 03/08/24  Yes Particia Nearing, PA-C  ondansetron (ZOFRAN-ODT) 4 MG disintegrating tablet Take 1 tablet (4 mg total) by mouth every 8 (eight) hours as needed for nausea or vomiting. 03/08/24  Yes Particia Nearing, PA-C    Family History Family History  Problem Relation Age of  Onset   CAD Mother        triple bypass   Epilepsy Other     Social History Social History   Tobacco Use   Smoking status: Never   Smokeless tobacco: Never  Vaping Use   Vaping status: Never Used  Substance Use Topics   Alcohol use: Yes    Comment: occasionally   Drug use: No     Allergies   Patient has no known allergies.   Review of Systems Review of Systems PER HPI  Physical Exam Triage Vital Signs ED Triage Vitals  Encounter Vitals Group     BP 03/08/24 0934 (!) 143/88     Systolic BP Percentile --      Diastolic BP Percentile --      Pulse Rate 03/08/24 0934 91     Resp 03/08/24 0934 18     Temp 03/08/24 0934 99.1 F (37.3 C)     Temp Source 03/08/24 0934 Oral     SpO2 03/08/24 0934 95 %     Weight --      Height --  Head Circumference --      Peak Flow --      Pain Score 03/08/24 0935 6     Pain Loc --      Pain Education --      Exclude from Growth Chart --    No data found.  Updated Vital Signs BP (!) 143/88 (BP Location: Right Arm)   Pulse 91   Temp 99.1 F (37.3 C) (Oral)   Resp 18   LMP 02/22/2024 (Approximate)   SpO2 95%   Visual Acuity Right Eye Distance:   Left Eye Distance:   Bilateral Distance:    Right Eye Near:   Left Eye Near:    Bilateral Near:     Physical Exam Vitals and nursing note reviewed.  Constitutional:      Appearance: Normal appearance. She is not ill-appearing.  HENT:     Head: Atraumatic.  Eyes:     Extraocular Movements: Extraocular movements intact.     Conjunctiva/sclera: Conjunctivae normal.  Cardiovascular:     Rate and Rhythm: Normal rate and regular rhythm.     Heart sounds: Normal heart sounds.  Pulmonary:     Effort: Pulmonary effort is normal.     Breath sounds: Normal breath sounds.  Abdominal:     General: Bowel sounds are normal. There is no distension.     Palpations: Abdomen is soft.     Tenderness: There is no abdominal tenderness. There is no right CVA tenderness, left CVA  tenderness or guarding.  Musculoskeletal:        General: Normal range of motion.     Cervical back: Normal range of motion and neck supple.  Skin:    General: Skin is warm and dry.  Neurological:     Mental Status: She is alert and oriented to person, place, and time.  Psychiatric:        Mood and Affect: Mood normal.        Thought Content: Thought content normal.        Judgment: Judgment normal.      UC Treatments / Results  Labs (all labs ordered are listed, but only abnormal results are displayed) Labs Reviewed  POC COVID19/FLU A&B COMBO    EKG   Radiology No results found.  Procedures Procedures (including critical care time)  Medications Ordered in UC Medications  ondansetron (ZOFRAN-ODT) disintegrating tablet 4 mg (4 mg Oral Given 03/08/24 1122)    Initial Impression / Assessment and Plan / UC Course  I have reviewed the triage vital signs and the nursing notes.  Pertinent labs & imaging results that were available during my care of the patient were reviewed by me and considered in my medical decision making (see chart for details).     Vitals and exam overall reassuring, COVID and flu testing negative. Suspect viral GI illness. Treat with zofran, imodium, supportive OTC medications and home care. Return for worsening sxs.  Final Clinical Impressions(s) / UC Diagnoses   Final diagnoses:  Nausea without vomiting  Diarrhea, unspecified type   Discharge Instructions   None    ED Prescriptions     Medication Sig Dispense Auth. Provider   ondansetron (ZOFRAN-ODT) 4 MG disintegrating tablet Take 1 tablet (4 mg total) by mouth every 8 (eight) hours as needed for nausea or vomiting. 20 tablet Particia Nearing, [place]   loperamide (IMODIUM) 2 MG capsule Take 1 capsule (2 mg total) by mouth 4 (four) times daily as needed for diarrhea or loose stools.  12 capsule Particia Nearing, [place]      PDMP not reviewed this encounter.   Particia Nearing, [place] 03/12/24 2211
